# Patient Record
Sex: Female | Born: 1980 | Race: White | Hispanic: No | Marital: Married | Smoking: Current every day smoker
Health system: Southern US, Community
[De-identification: ages and names within clinical notes are randomized; demographics above are authoritative.]

## PROBLEM LIST (undated history)

## (undated) DIAGNOSIS — E282 Polycystic ovarian syndrome: Secondary | ICD-10-CM

## (undated) DIAGNOSIS — D219 Benign neoplasm of connective and other soft tissue, unspecified: Secondary | ICD-10-CM

## (undated) DIAGNOSIS — E669 Obesity, unspecified: Secondary | ICD-10-CM

## (undated) DIAGNOSIS — K829 Disease of gallbladder, unspecified: Secondary | ICD-10-CM

## (undated) HISTORY — PX: APPENDECTOMY: SHX54

## (undated) HISTORY — PX: CARPAL TUNNEL RELEASE: SHX101

## (undated) HISTORY — PX: ULNAR NERVE REPAIR: SHX2594

## (undated) HISTORY — PX: OTHER SURGICAL HISTORY: SHX169

## (undated) HISTORY — PX: GANGLION CYST EXCISION: SHX1691

---

## 2003-12-30 ENCOUNTER — Emergency Department (HOSPITAL_COMMUNITY): Admission: EM | Admit: 2003-12-30 | Discharge: 2003-12-30 | Payer: Self-pay | Admitting: Emergency Medicine

## 2013-08-13 ENCOUNTER — Emergency Department (HOSPITAL_BASED_OUTPATIENT_CLINIC_OR_DEPARTMENT_OTHER): Payer: Self-pay

## 2013-08-13 ENCOUNTER — Emergency Department (HOSPITAL_BASED_OUTPATIENT_CLINIC_OR_DEPARTMENT_OTHER)
Admission: EM | Admit: 2013-08-13 | Discharge: 2013-08-13 | Disposition: A | Discharge status: Home / Self Care | Payer: Self-pay | Attending: Emergency Medicine | Admitting: Emergency Medicine

## 2013-08-13 ENCOUNTER — Encounter (HOSPITAL_BASED_OUTPATIENT_CLINIC_OR_DEPARTMENT_OTHER): Payer: Self-pay | Admitting: Emergency Medicine

## 2013-08-13 DIAGNOSIS — R059 Cough, unspecified: Secondary | ICD-10-CM | POA: Insufficient documentation

## 2013-08-13 DIAGNOSIS — F17200 Nicotine dependence, unspecified, uncomplicated: Secondary | ICD-10-CM | POA: Insufficient documentation

## 2013-08-13 DIAGNOSIS — R05 Cough: Secondary | ICD-10-CM | POA: Insufficient documentation

## 2013-08-13 DIAGNOSIS — Z91040 Latex allergy status: Secondary | ICD-10-CM | POA: Insufficient documentation

## 2013-08-13 DIAGNOSIS — J40 Bronchitis, not specified as acute or chronic: Secondary | ICD-10-CM

## 2013-08-13 HISTORY — DX: Polycystic ovarian syndrome: E28.2

## 2013-08-13 MED ORDER — AZITHROMYCIN 250 MG PO TABS: 250.0000 mg | ORAL_TABLET | Freq: Every day | ORAL | Status: AC

## 2013-08-13 MED ORDER — HYDROCODONE-HOMATROPINE 5-1.5 MG/5ML PO SYRP: 5.0000 mL | ORAL_SOLUTION | Freq: Four times a day (QID) | ORAL | Status: DC | PRN

## 2013-08-13 NOTE — ED Provider Notes (Signed)
Medical screening examination/treatment/procedure(s) were performed by non-physician practitioner and as supervising physician I was immediately available for consultation/collaboration.  EKG Interpretation   None         Tasheka Houseman, MD 08/13/13 2344 

## 2013-08-13 NOTE — ED Provider Notes (Signed)
CSN: [id number]     Arrival date & time 08/13/13  1715 History   First MD Initiated Contact with Patient 08/13/13 1728     Chief Complaint  Patient presents with  . URI   (Consider location/radiation/quality/duration/timing/severity/associated sxs/prior Treatment) Patient is a 33 y.o. female presenting with URI. The history is provided by the patient. No language interpreter was used.  URI Presenting symptoms: congestion and cough   Severity:  Moderate Onset quality:  Gradual Duration:  1 week Timing:  Constant Progression:  Worsening Chronicity:  New Relieved by:  Nothing Worsened by:  Nothing tried Ineffective treatments:  None tried Associated symptoms: sinus pain   Associated symptoms: no headaches   Risk factors: no sick contacts     Past Medical History  Diagnosis Date  . Polycystic ovaries    Past Surgical History  Procedure Laterality Date  . Cesarean section    . Appendectomy    . Carpal tunnel release     History reviewed. No pertinent family history. History  Substance Use Topics  . Smoking status: Current Every Day Smoker -- 1.00 packs/day    Types: Cigarettes  . Smokeless tobacco: Not on file  . Alcohol Use: No   OB History   Grav Para Term Preterm Abortions TAB SAB Ect Mult Living                 Review of Systems  HENT: Positive for congestion.   Respiratory: Positive for cough.   Neurological: Negative for headaches.  All other systems reviewed and are negative.    Allergies  Latex and Toradol  Home Medications  No current outpatient prescriptions on file. BP 151/93  Pulse 113  Temp(Src) 100 F (37.8 C) (Oral)  Resp 18  Ht 5\' 3"  (1.6 m)  Wt 218 lb (98.884 kg)  BMI 38.63 kg/m2  SpO2 95% Physical Exam  Nursing note and vitals reviewed. Constitutional: She appears well-developed and well-nourished.  HENT:  Head: Normocephalic.  Right Ear: External ear normal.  Left Ear: External ear normal.  Nose: Nose normal.  Mouth/Throat:  Oropharynx is clear and moist.  Eyes: Conjunctivae and EOM are normal. Pupils are equal, round, and reactive to light.  Neck: Normal range of motion.  Cardiovascular: Normal rate and normal heart sounds.   Pulmonary/Chest: Effort normal and breath sounds normal.  Abdominal: Soft.  Musculoskeletal: Normal range of motion.  Neurological: She is alert.  Skin: Skin is warm.  Psychiatric: She has a normal mood and affect.    ED Course  Procedures (including critical care time) Labs Review Labs Reviewed - No data to display Imaging Review Dg Chest 2 View  08/13/2013   CLINICAL DATA:  Cough, congestion, smoker  EXAM: CHEST  2 VIEW  COMPARISON:  None.  FINDINGS: Cardiomediastinal silhouette is unremarkable. No acute infiltrate or pleural effusion. No pulmonary edema. Central mild bronchitic changes.  IMPRESSION: No acute infiltrate or pulmonary edema. Central mild bronchitic changes.   Electronically Signed   By: Natasha Mead M.D.   On: 08/13/2013 18:33    EKG Interpretation   None       MDM  Pt has productive cough, smoker,    Pt given rx for zithromax,  Hycodan for cough   1. Bronchitis    No results found for this or any previous visit. Dg Chest 2 View  08/13/2013   CLINICAL DATA:  Cough, congestion, smoker  EXAM: CHEST  2 VIEW  COMPARISON:  None.  FINDINGS: Cardiomediastinal silhouette is unremarkable.  No acute infiltrate or pleural effusion. No pulmonary edema. Central mild bronchitic changes.  IMPRESSION: No acute infiltrate or pulmonary edema. Central mild bronchitic changes.   Electronically Signed   By: Natasha Mead M.D.   On: 08/13/2013 18:33       Elson Areas, PA-C 08/13/13 1857

## 2013-08-13 NOTE — ED Notes (Signed)
Pt reports cough and fever x 1 week.  °

## 2013-08-13 NOTE — ED Notes (Signed)
Pt given cool wet washcloth, as she reports being extremely hot.

## 2013-09-15 ENCOUNTER — Emergency Department (HOSPITAL_BASED_OUTPATIENT_CLINIC_OR_DEPARTMENT_OTHER): Payer: Self-pay

## 2013-09-15 ENCOUNTER — Encounter (HOSPITAL_BASED_OUTPATIENT_CLINIC_OR_DEPARTMENT_OTHER): Payer: Self-pay | Admitting: Emergency Medicine

## 2013-09-15 ENCOUNTER — Emergency Department (HOSPITAL_BASED_OUTPATIENT_CLINIC_OR_DEPARTMENT_OTHER)
Admission: EM | Admit: 2013-09-15 | Discharge: 2013-09-15 | Disposition: A | Discharge status: Home / Self Care | Payer: Self-pay | Attending: Emergency Medicine | Admitting: Emergency Medicine

## 2013-09-15 DIAGNOSIS — Z792 Long term (current) use of antibiotics: Secondary | ICD-10-CM | POA: Insufficient documentation

## 2013-09-15 DIAGNOSIS — Z91040 Latex allergy status: Secondary | ICD-10-CM | POA: Insufficient documentation

## 2013-09-15 DIAGNOSIS — Z862 Personal history of diseases of the blood and blood-forming organs and certain disorders involving the immune mechanism: Secondary | ICD-10-CM | POA: Insufficient documentation

## 2013-09-15 DIAGNOSIS — Z8639 Personal history of other endocrine, nutritional and metabolic disease: Secondary | ICD-10-CM | POA: Insufficient documentation

## 2013-09-15 DIAGNOSIS — J45909 Unspecified asthma, uncomplicated: Secondary | ICD-10-CM

## 2013-09-15 DIAGNOSIS — J3489 Other specified disorders of nose and nasal sinuses: Secondary | ICD-10-CM | POA: Insufficient documentation

## 2013-09-15 DIAGNOSIS — R509 Fever, unspecified: Secondary | ICD-10-CM | POA: Insufficient documentation

## 2013-09-15 DIAGNOSIS — IMO0001 Reserved for inherently not codable concepts without codable children: Secondary | ICD-10-CM | POA: Insufficient documentation

## 2013-09-15 DIAGNOSIS — J45901 Unspecified asthma with (acute) exacerbation: Secondary | ICD-10-CM | POA: Insufficient documentation

## 2013-09-15 DIAGNOSIS — F17200 Nicotine dependence, unspecified, uncomplicated: Secondary | ICD-10-CM | POA: Insufficient documentation

## 2013-09-15 MED ORDER — ALBUTEROL SULFATE (2.5 MG/3ML) 0.083% IN NEBU: 2.5000 mg | INHALATION_SOLUTION | Freq: Once | RESPIRATORY_TRACT | Status: DC

## 2013-09-15 MED ORDER — PREDNISONE 20 MG PO TABS: 40.0000 mg | ORAL_TABLET | Freq: Every day | ORAL | Status: AC

## 2013-09-15 NOTE — Discharge Instructions (Signed)

## 2013-09-15 NOTE — ED Notes (Signed)
Fever, cough worse at night.

## 2013-09-15 NOTE — ED Provider Notes (Signed)
Medical screening examination/treatment/procedure(s) were performed by non-physician practitioner and as supervising physician I was immediately available for consultation/collaboration.  EKG Interpretation   None         Sharyon Cable, MD 09/15/13 1546

## 2013-09-15 NOTE — ED Provider Notes (Signed)
CSN: [id number]     Arrival date & time 09/15/13  1333 History   First MD Initiated Contact with Patient 09/15/13 1344     Chief Complaint  Patient presents with  . Influenza   (Consider location/radiation/quality/duration/timing/severity/associated sxs/prior Treatment) Patient is a 33 y.o. female presenting with URI. The history is provided by the patient.  URI Presenting symptoms: congestion, cough, fatigue, fever ( subjective) and rhinorrhea   Presenting symptoms: no sore throat   Congestion:    Location:  Chest   Interferes with sleep: yes     Interferes with eating/drinking: no   Cough:    Cough characteristics:  Hacking   Sputum characteristics:  Nondescript   Severity:  Moderate   Onset quality:  Gradual   Duration:  1 month   Timing:  Intermittent   Progression:  Unchanged   Chronicity:  Recurrent Severity:  Moderate Onset quality:  Gradual Duration:  1 month Timing:  Intermittent Progression:  Unchanged Chronicity:  Recurrent Relieved by:  None tried Exacerbated by: worse at night. Ineffective treatments:  OTC medications (couple of puffs intermittently with albuterol MDI) Associated symptoms: myalgias and wheezing   Associated symptoms: no arthralgias, no headaches, no sinus pain and no swollen glands   Risk factors comment:  Smoker   Past Medical History  Diagnosis Date  . Polycystic ovaries    Past Surgical History  Procedure Laterality Date  . Cesarean section    . Appendectomy    . Carpal tunnel release     No family history on file. History  Substance Use Topics  . Smoking status: Current Every Day Smoker -- 1.00 packs/day    Types: Cigarettes  . Smokeless tobacco: Not on file  . Alcohol Use: No   OB History   Grav Para Term Preterm Abortions TAB SAB Ect Mult Living                 Review of Systems  Constitutional: Positive for fever ( subjective) and fatigue.  HENT: Positive for congestion and rhinorrhea. Negative for sore throat.    Eyes: Negative for redness.  Respiratory: Positive for cough and wheezing. Negative for shortness of breath.   Cardiovascular: Negative for palpitations.  Gastrointestinal: Negative for abdominal pain.  Genitourinary: Negative for dysuria.  Musculoskeletal: Positive for myalgias. Negative for arthralgias and back pain.  Skin: Negative for rash.  Neurological: Negative for headaches.  Hematological: Negative for adenopathy.  Psychiatric/Behavioral: Negative for agitation.    Allergies  Latex and Toradol  Home Medications   Current Outpatient Rx  Name  Route  Sig  Dispense  Refill  . azithromycin (ZITHROMAX) 250 MG tablet   Oral   Take 1 tablet (250 mg total) by mouth daily. Take first 2 tablets together, then 1 every day until finished.   6 tablet   0   . HYDROcodone-homatropine (HYDROMET) 5-1.5 MG/5ML syrup   Oral   Take 5 mLs by mouth every 6 (six) hours as needed for cough.   120 mL   0   . predniSONE (DELTASONE) 20 MG tablet   Oral   Take 2 tablets (40 mg total) by mouth daily.   10 tablet   0    BP 143/84  Pulse 89  Temp(Src) 99 F (37.2 C) (Oral)  Resp 20  Ht 5\' 3"  (1.6 m)  Wt 218 lb (98.884 kg)  BMI 38.63 kg/m2  SpO2 99% Physical Exam  Nursing note and vitals reviewed. Constitutional: She is oriented to person, place, and  time. She appears well-developed and well-nourished.  HENT:  Head: Normocephalic and atraumatic.  Right Ear: Tympanic membrane and external ear normal.  Left Ear: Tympanic membrane and external ear normal.  Nose: Nose normal.  Mouth/Throat: Oropharynx is clear and moist.  Eyes: Conjunctivae and EOM are normal.  Cardiovascular: Normal rate, regular rhythm, normal heart sounds and intact distal pulses.   Pulmonary/Chest: Effort normal. She has decreased breath sounds in the right lower field and the left lower field.  Abdominal: Soft. There is no tenderness.  Musculoskeletal: Normal range of motion. She exhibits no edema.   Neurological: She is alert and oriented to person, place, and time.  Skin: Skin is warm and dry.  Psychiatric: She has a normal mood and affect.    ED Course  Procedures (including critical care time) Labs Review Labs Reviewed - No data to display Imaging Review Dg Chest 2 View  09/15/2013   CLINICAL DATA:  Cough and congestion with fever and chills.  EXAM: CHEST  2 VIEW  COMPARISON:  08/13/2013.  FINDINGS: Cardiopericardial silhouette within normal limits. Mediastinal contours normal. Trachea midline. No airspace disease or effusion.  IMPRESSION: No active cardiopulmonary disease.   Electronically Signed   By: Dereck Ligas M.D.   On: 09/15/2013 14:33    EKG Interpretation   None       MDM   1. Reactive airway disease    33 yo female with hx of reactive airway presents with persistent cough s/p URI 1 month ago. Has already completed a course of zithromax, CXR neg no pneumonia. Afebrile and nontoxic appearing here, doubt bacterial pneumonia. Likely reactive airway exacerbation, will treat with short course of steroids and recommend continued use of albuterol. Stable for d/c, arrange f/u with a PCP.    Liliane Bade, NP 09/15/13 [phone]

## 2013-09-22 ENCOUNTER — Emergency Department (HOSPITAL_BASED_OUTPATIENT_CLINIC_OR_DEPARTMENT_OTHER)
Admission: EM | Admit: 2013-09-22 | Discharge: 2013-09-22 | Disposition: A | Discharge status: Home / Self Care | Payer: Self-pay | Attending: Emergency Medicine | Admitting: Emergency Medicine

## 2013-09-22 ENCOUNTER — Emergency Department (HOSPITAL_BASED_OUTPATIENT_CLINIC_OR_DEPARTMENT_OTHER): Payer: Self-pay

## 2013-09-22 ENCOUNTER — Encounter (HOSPITAL_BASED_OUTPATIENT_CLINIC_OR_DEPARTMENT_OTHER): Payer: Self-pay | Admitting: Emergency Medicine

## 2013-09-22 DIAGNOSIS — Z792 Long term (current) use of antibiotics: Secondary | ICD-10-CM | POA: Insufficient documentation

## 2013-09-22 DIAGNOSIS — H9209 Otalgia, unspecified ear: Secondary | ICD-10-CM | POA: Insufficient documentation

## 2013-09-22 DIAGNOSIS — Z8639 Personal history of other endocrine, nutritional and metabolic disease: Secondary | ICD-10-CM | POA: Insufficient documentation

## 2013-09-22 DIAGNOSIS — Z862 Personal history of diseases of the blood and blood-forming organs and certain disorders involving the immune mechanism: Secondary | ICD-10-CM | POA: Insufficient documentation

## 2013-09-22 DIAGNOSIS — J020 Streptococcal pharyngitis: Secondary | ICD-10-CM | POA: Insufficient documentation

## 2013-09-22 DIAGNOSIS — IMO0001 Reserved for inherently not codable concepts without codable children: Secondary | ICD-10-CM | POA: Insufficient documentation

## 2013-09-22 DIAGNOSIS — F17200 Nicotine dependence, unspecified, uncomplicated: Secondary | ICD-10-CM | POA: Insufficient documentation

## 2013-09-22 DIAGNOSIS — Z91040 Latex allergy status: Secondary | ICD-10-CM | POA: Insufficient documentation

## 2013-09-22 LAB — CBC WITH DIFFERENTIAL/PLATELET
Basophils Absolute: 0 10*3/uL (ref 0.0–0.1)
Basophils Relative: 0 % (ref 0–1)
Eosinophils Absolute: 0.2 10*3/uL (ref 0.0–0.7)
Eosinophils Relative: 1 % (ref 0–5)
HEMATOCRIT: 38.9 % (ref 36.0–46.0)
HEMOGLOBIN: 13.2 g/dL (ref 12.0–15.0)
LYMPHS ABS: 2.6 10*3/uL (ref 0.7–4.0)
Lymphocytes Relative: 19 % (ref 12–46)
MCH: 29.5 pg (ref 26.0–34.0)
MCHC: 33.9 g/dL (ref 30.0–36.0)
MCV: 86.8 fL (ref 78.0–100.0)
MONO ABS: 1 10*3/uL (ref 0.1–1.0)
MONOS PCT: 8 % (ref 3–12)
NEUTROS PCT: 72 % (ref 43–77)
Neutro Abs: 9.6 10*3/uL — ABNORMAL HIGH (ref 1.7–7.7)
Platelets: 271 10*3/uL (ref 150–400)
RBC: 4.48 MIL/uL (ref 3.87–5.11)
RDW: 14.7 % (ref 11.5–15.5)
WBC: 13.4 10*3/uL — ABNORMAL HIGH (ref 4.0–10.5)

## 2013-09-22 LAB — BASIC METABOLIC PANEL
BUN: 14 mg/dL (ref 6–23)
CHLORIDE: 100 meq/L (ref 96–112)
CO2: 24 mEq/L (ref 19–32)
CREATININE: 0.7 mg/dL (ref 0.50–1.10)
Calcium: 9.3 mg/dL (ref 8.4–10.5)
GFR calc Af Amer: >90 mL/min (ref >90)
GFR calc non Af Amer: >90 mL/min (ref >90)
GLUCOSE: 100 mg/dL — AB (ref 70–99)
Potassium: 3.7 mEq/L (ref 3.7–5.3)
Sodium: 139 mEq/L (ref 137–147)

## 2013-09-22 LAB — RAPID STREP SCREEN (MED CTR MEBANE ONLY): Streptococcus, Group A Screen (Direct): POSITIVE — AB

## 2013-09-22 MED ORDER — GI COCKTAIL ~~LOC~~
30.0000 mL | Freq: Once | ORAL | Status: AC
  Administered 2013-09-22: 30 mL via ORAL

## 2013-09-22 MED ORDER — PENICILLIN G BENZATHINE 1200000 UNIT/2ML IM SUSP
1.2000 10*6.[IU] | Freq: Once | INTRAMUSCULAR | Status: AC
  Administered 2013-09-22: 1.2 10*6.[IU] via INTRAMUSCULAR
  Filled 2013-09-22: qty 2

## 2013-09-22 MED ORDER — IOHEXOL 300 MG/ML  SOLN
75.0000 mL | Freq: Once | INTRAMUSCULAR | Status: AC | PRN
  Administered 2013-09-22: 75 mL via INTRAVENOUS

## 2013-09-22 MED ORDER — HYDROMORPHONE HCL PF 1 MG/ML IJ SOLN
0.5000 mg | Freq: Once | INTRAMUSCULAR | Status: AC
  Administered 2013-09-22: 0.5 mg via INTRAVENOUS
  Filled 2013-09-22: qty 1

## 2013-09-22 MED ORDER — HYDROCODONE-ACETAMINOPHEN 7.5-325 MG/15ML PO SOLN: 15.0000 mL | Freq: Four times a day (QID) | ORAL | Status: AC | PRN

## 2013-09-22 MED ORDER — HYDROCOD POLST-CHLORPHEN POLST 10-8 MG/5ML PO LQCR
5.0000 mL | Freq: Once | ORAL | Status: AC
  Administered 2013-09-22: 5 mL via ORAL
  Filled 2013-09-22: qty 5

## 2013-09-22 MED ORDER — DEXAMETHASONE SODIUM PHOSPHATE 10 MG/ML IJ SOLN
10.0000 mg | Freq: Once | INTRAMUSCULAR | Status: AC
  Administered 2013-09-22: 10 mg via INTRAVENOUS
  Filled 2013-09-22: qty 1

## 2013-09-22 MED ORDER — GI COCKTAIL ~~LOC~~
ORAL | Status: AC
  Filled 2013-09-22: qty 30

## 2013-09-22 NOTE — ED Provider Notes (Signed)
CSN: [id number]     Arrival date & time 09/22/13  1802 History   First MD Initiated Contact with Patient 09/22/13 1824     Chief Complaint  Patient presents with  . URI   (Consider location/radiation/quality/duration/timing/severity/associated sxs/prior Treatment) HPI Comments: Patient is otherwise healthy 33 year old female who presents to the ED for re-evaluation of URI type symptoms.  She reports continued cough with green sputum production, sore throat, fever, chills, headache, body aches.  She states that she has been seen twice here before and told she had a virus.  She states that she went for a job interview and [employer] today and the doctor at the urgent care told her she may have a peritonsilar abscess.  Patient is a 33 y.o. female presenting with URI. The history is provided by the patient. No language interpreter was used.  URI Presenting symptoms: congestion, cough, ear pain, fever and sore throat   Presenting symptoms: no facial pain, no fatigue and no rhinorrhea   Severity:  Severe Onset quality:  Gradual Duration:  10 days Timing:  Constant Progression:  Worsening Chronicity:  New Relieved by:  Decongestant and OTC medications Worsened by:  Nothing tried Ineffective treatments:  None tried Associated symptoms: headaches and myalgias   Associated symptoms: no arthralgias, no neck pain, no sinus pain, no sneezing, no swollen glands and no wheezing   Risk factors: no diabetes mellitus and no sick contacts     Past Medical History  Diagnosis Date  . Polycystic ovaries    Past Surgical History  Procedure Laterality Date  . Cesarean section    . Appendectomy    . Carpal tunnel release     History reviewed. No pertinent family history. History  Substance Use Topics  . Smoking status: Current Every Day Smoker -- 1.00 packs/day    Types: Cigarettes  . Smokeless tobacco: Not on file  . Alcohol Use: No   OB History   Grav Para Term Preterm Abortions TAB SAB Ect Mult  Living                 Review of Systems  Constitutional: Positive for fever. Negative for fatigue.  HENT: Positive for congestion, ear pain and sore throat. Negative for rhinorrhea and sneezing.   Respiratory: Positive for cough. Negative for wheezing.   Musculoskeletal: Positive for myalgias. Negative for arthralgias and neck pain.  Neurological: Positive for headaches.  All other systems reviewed and are negative.    Allergies  Latex and Toradol  Home Medications   Current Outpatient Rx  Name  Route  Sig  Dispense  Refill  . azithromycin (ZITHROMAX) 250 MG tablet   Oral   Take 1 tablet (250 mg total) by mouth daily. Take first 2 tablets together, then 1 every day until finished.   6 tablet   0   . HYDROcodone-acetaminophen (HYCET) 7.5-325 mg/15 ml solution   Oral   Take 15 mLs by mouth 4 (four) times daily as needed for moderate pain.   120 mL   0   . HYDROcodone-homatropine (HYDROMET) 5-1.5 MG/5ML syrup   Oral   Take 5 mLs by mouth every 6 (six) hours as needed for cough.   120 mL   0    BP 137/82  Pulse 100  Temp(Src) 98.5 F (36.9 C) (Oral)  Resp 18  Ht 5\' 3"  (1.6 m)  Wt 218 lb (98.884 kg)  BMI 38.63 kg/m2  SpO2 98% Physical Exam  Nursing note and vitals reviewed.  Constitutional: She is oriented to person, place, and time. She appears well-developed and well-nourished. No distress.  HENT:  Head: Normocephalic and atraumatic.  Right Ear: External ear normal.  Left Ear: External ear normal.  Mouth/Throat: Mucous membranes are normal. No trismus in the jaw. No uvula swelling. Oropharyngeal exudate and posterior oropharyngeal erythema present. No tonsillar abscesses.    Eyes: Conjunctivae are normal. Pupils are equal, round, and reactive to light. No scleral icterus.  Neck: Normal range of motion. Neck supple.  Right anterior cervical adenopathy  Cardiovascular: Normal rate, regular rhythm and normal heart sounds.  Exam reveals no gallop and no friction  rub.   No murmur heard. Pulmonary/Chest: Effort normal and breath sounds normal. No respiratory distress. She has no wheezes. She has no rales. She exhibits no tenderness.  Abdominal: Soft. Bowel sounds are normal. She exhibits no distension. There is no tenderness.  Musculoskeletal: Normal range of motion. She exhibits no edema and no tenderness.  Lymphadenopathy:    She has cervical adenopathy.  Neurological: She is alert and oriented to person, place, and time. She exhibits normal muscle tone. Coordination normal.  Skin: Skin is warm and dry. No rash noted. No erythema. No pallor.  Psychiatric: She has a normal mood and affect. Her behavior is normal. Judgment and thought content normal.    ED Course  Procedures (including critical care time) Labs Review Labs Reviewed  RAPID STREP SCREEN - Abnormal; Notable for the following:    Streptococcus, Group A Screen (Direct) POSITIVE (*)    All other components within normal limits  CBC WITH DIFFERENTIAL - Abnormal; Notable for the following:    WBC 13.4 (*)    Neutro Abs 9.6 (*)    All other components within normal limits  BASIC METABOLIC PANEL - Abnormal; Notable for the following:    Glucose, Bld 100 (*)    All other components within normal limits   Imaging Review Ct Soft Tissue Neck W Contrast  09/22/2013   CLINICAL DATA:  URI symptoms, sore throat  EXAM: CT NECK WITH CONTRAST  TECHNIQUE: Multidetector CT imaging of the neck was performed using the standard protocol following the bolus administration of intravenous contrast.  CONTRAST:  70mL OMNIPAQUE IOHEXOL 300 MG/ML  SOLN  COMPARISON:  None available  FINDINGS: The visualized portions of the brain demonstrate a normal appearance. The visualized orbits are unremarkable.  The paranasal sinuses in and mastoid air cells are clear.  The salivary glands including the parotid glands and submandibular glands are unremarkable.  The oral cavity is within normal limits without evidence of  loculated fluid collection or mass lesion. The oropharynx including the palatine tonsils is within normal limits. The parapharyngeal fat is preserved. Airways normal widely patent. Nasopharynx including the adenoids are unremarkable. The vallecula is clear. Lingual tonsils are unremarkable. Epiglottis is normal. The hypopharynx and supraglottic larynx is within normal limits. True vocal cords are symmetric bilaterally. Subglottic airway is clear.  Trachea is midline and patent.  A few mildly prominent right level 2/3 nodes measuring up to 1.1 cm in short axis are present (series 5, image 44), which may be reactive in nature. No mass lesion or loculated fluid collection identified within the soft tissues of the neck.  Visualized superior mediastinum is within normal limits without evidence of adenopathy or mass lesion.  The visualized lung apices are clear.  Normal intravascular enhancement is seen throughout the neck.  No acute osseous abnormality identified. No focal lytic or blastic osseous lesions.  IMPRESSION:  1. No loculated fluid collection to suggest abscess identified within the neck. No significant inflammatory changes identified. 2. Mildly prominent right level II/III cervical adenopathy, which may be reactive in nature. Clinical followup to resolution is recommended   Electronically Signed   By: Jeannine Boga M.D.   On: 09/22/2013 20:53    EKG Interpretation   None      Medications  dexamethasone (DECADRON) injection 10 mg (10 mg Intravenous Given 09/22/13 1849)  chlorpheniramine-HYDROcodone (TUSSIONEX) 10-8 MG/5ML suspension 5 mL (5 mLs Oral Given 09/22/13 1849)  HYDROmorphone (DILAUDID) injection 0.5 mg (0.5 mg Intravenous Given 09/22/13 1850)  iohexol (OMNIPAQUE) 300 MG/ML solution 75 mL (75 mLs Intravenous Contrast Given 09/22/13 2010)  gi cocktail (Maalox,Lidocaine,Donnatal) (30 mLs Oral Given 09/22/13 2022)  penicillin g benzathine (BICILLIN LA) 1200000 UNIT/2ML injection 1.2 Million  Units (1.2 Million Units Intramuscular Given 09/22/13 2052)     MDM   1. Strep pharyngitis    Patient here with continued URI symptoms now with sore throat, clinically with right greater than left tonsilar swelling and erythema, no abscess from CT scan, pain more tolerable after medications here.  No trismus, nausea, vomiting.    Idalia Needle Joelyn Oms, [place] 09/22/13 2202

## 2013-09-22 NOTE — ED Notes (Addendum)
Pt c/o URI symptoms  And sore throat x 1 week sent here from [facility] for abscess to throat

## 2013-09-22 NOTE — ED Notes (Signed)
PA at bedside.

## 2013-09-22 NOTE — Discharge Instructions (Signed)
Strep Throat  Strep throat is an infection of the throat caused by a bacteria named Streptococcus pyogenes. Your caregiver may call the infection streptococcal "tonsillitis" or "pharyngitis" depending on whether there are signs of inflammation in the tonsils or back of the throat. Strep throat is most common in children aged 35 15 years during the cold months of the year, but it can occur in people of any age during any season. This infection is spread from person to person (contagious) through coughing, sneezing, or other close contact.  SYMPTOMS   · Fever or chills.  · Painful, swollen, red tonsils or throat.  · Pain or difficulty when swallowing.  · White or yellow spots on the tonsils or throat.  · Swollen, tender lymph nodes or "glands" of the neck or under the jaw.  · Red rash all over the body (rare).  DIAGNOSIS   Many different infections can cause the same symptoms. A test must be done to confirm the diagnosis so the right treatment can be given. A "rapid strep test" can help your caregiver make the diagnosis in a few minutes. If this test is not available, a light swab of the infected area can be used for a throat culture test. If a throat culture test is done, results are usually available in a day or two.  TREATMENT   Strep throat is treated with antibiotic medicine.  HOME CARE INSTRUCTIONS   · Gargle with 1 tsp of salt in 1 cup of warm water, 3 4 times per day or as needed for comfort.  · Family members who also have a sore throat or fever should be tested for strep throat and treated with antibiotics if they have the strep infection.  · Make sure everyone in your household washes their hands well.  · Do not share food, drinking cups, or personal items that could cause the infection to spread to others.  · You may need to eat a soft food diet until your sore throat gets better.  · Drink enough water and fluids to keep your urine clear or pale yellow. This will help prevent dehydration.  · Get plenty of  rest.  · Stay home from school, daycare, or work until you have been on antibiotics for 24 hours.  · Only take over-the-counter or prescription medicines for pain, discomfort, or fever as directed by your caregiver.  · If antibiotics are prescribed, take them as directed. Finish them even if you start to feel better.  SEEK MEDICAL CARE IF:   · The glands in your neck continue to enlarge.  · You develop a rash, cough, or earache.  · You cough up green, yellow-brown, or bloody sputum.  · You have pain or discomfort not controlled by medicines.  · Your problems seem to be getting worse rather than better.  SEEK IMMEDIATE MEDICAL CARE IF:   · You develop any new symptoms such as vomiting, severe headache, stiff or painful neck, chest pain, shortness of breath, or trouble swallowing.  · You develop severe throat pain, drooling, or changes in your voice.  · You develop swelling of the neck, or the skin on the neck becomes red and tender.  · You have a fever.  · You develop signs of dehydration, such as fatigue, dry mouth, and decreased urination.  · You become increasingly sleepy, or you cannot wake up completely.  Document Released: 08/25/2000 Document Revised: 08/14/2012 Document Reviewed: 10/27/2010  [employer]® Patient Information ©2014 [employer].

## 2013-09-23 NOTE — ED Provider Notes (Signed)
Medical screening examination/treatment/procedure(s) were performed by non-physician practitioner and as supervising physician I was immediately available for consultation/collaboration.  EKG Interpretation   None         Ephraim Hamburger, MD 09/23/13 [phone]

## 2013-09-27 ENCOUNTER — Encounter (HOSPITAL_BASED_OUTPATIENT_CLINIC_OR_DEPARTMENT_OTHER): Payer: Self-pay | Admitting: Emergency Medicine

## 2013-09-27 ENCOUNTER — Emergency Department (HOSPITAL_BASED_OUTPATIENT_CLINIC_OR_DEPARTMENT_OTHER)
Admission: EM | Admit: 2013-09-27 | Discharge: 2013-09-27 | Disposition: A | Discharge status: Home / Self Care | Payer: Self-pay | Attending: Emergency Medicine | Admitting: Emergency Medicine

## 2013-09-27 DIAGNOSIS — Z862 Personal history of diseases of the blood and blood-forming organs and certain disorders involving the immune mechanism: Secondary | ICD-10-CM | POA: Insufficient documentation

## 2013-09-27 DIAGNOSIS — J0300 Acute streptococcal tonsillitis, unspecified: Secondary | ICD-10-CM

## 2013-09-27 DIAGNOSIS — F17200 Nicotine dependence, unspecified, uncomplicated: Secondary | ICD-10-CM | POA: Insufficient documentation

## 2013-09-27 DIAGNOSIS — Z792 Long term (current) use of antibiotics: Secondary | ICD-10-CM | POA: Insufficient documentation

## 2013-09-27 DIAGNOSIS — R000 Tachycardia, unspecified: Secondary | ICD-10-CM | POA: Insufficient documentation

## 2013-09-27 DIAGNOSIS — Z91040 Latex allergy status: Secondary | ICD-10-CM | POA: Insufficient documentation

## 2013-09-27 DIAGNOSIS — Z8639 Personal history of other endocrine, nutritional and metabolic disease: Secondary | ICD-10-CM | POA: Insufficient documentation

## 2013-09-27 DIAGNOSIS — J020 Streptococcal pharyngitis: Secondary | ICD-10-CM | POA: Insufficient documentation

## 2013-09-27 MED ORDER — CLINDAMYCIN HCL 150 MG PO CAPS: 150.0000 mg | ORAL_CAPSULE | Freq: Four times a day (QID) | ORAL | Status: AC

## 2013-09-27 MED ORDER — LIDOCAINE VISCOUS 2 % MT SOLN: 20.0000 mL | OROMUCOSAL | Status: AC | PRN

## 2013-09-27 MED ORDER — LIDOCAINE VISCOUS 2 % MT SOLN
15.0000 mL | Freq: Once | OROMUCOSAL | Status: AC
  Administered 2013-09-27: 15 mL via OROMUCOSAL
  Filled 2013-09-27: qty 15

## 2013-09-27 MED ORDER — HYDROMORPHONE HCL PF 1 MG/ML IJ SOLN
1.0000 mg | Freq: Once | INTRAMUSCULAR | Status: AC
  Administered 2013-09-27: 1 mg via INTRAVENOUS
  Filled 2013-09-27: qty 1

## 2013-09-27 MED ORDER — DEXAMETHASONE SODIUM PHOSPHATE 10 MG/ML IJ SOLN
10.0000 mg | Freq: Once | INTRAMUSCULAR | Status: AC
  Administered 2013-09-27: 10 mg via INTRAVENOUS
  Filled 2013-09-27: qty 1

## 2013-09-27 MED ORDER — SODIUM CHLORIDE 0.9 % IV BOLUS (SEPSIS)
1000.0000 mL | Freq: Once | INTRAVENOUS | Status: AC
  Administered 2013-09-27: 1000 mL via INTRAVENOUS

## 2013-09-27 MED ORDER — CLINDAMYCIN PHOSPHATE 900 MG/50ML IV SOLN
900.0000 mg | Freq: Once | INTRAVENOUS | Status: AC
  Administered 2013-09-27: 900 mg via INTRAVENOUS
  Filled 2013-09-27: qty 50

## 2013-09-27 NOTE — ED Provider Notes (Signed)
CSN: [id number]     Arrival date & time 09/27/13  5427 History   First MD Initiated Contact with Patient 09/27/13 1204     Chief Complaint  Patient presents with  . Sore Throat   (Consider location/radiation/quality/duration/timing/severity/associated sxs/prior Treatment) HPI Chelsea Davidson is a 33 y.o. female who presents to the ED with a sore throat. She was evaluated 5 days ago and given Bicillin injection for strep and started on steroids. She complains of increased swelling of her tonsils. The right is worse than the left and she is afraid she is developing an abscess. She is taking the liquid pain medication but continues to have severe pain. She has no fever or chills. She denies any other problems at this time.  Past Medical History  Diagnosis Date  . Polycystic ovaries    Past Surgical History  Procedure Laterality Date  . Cesarean section    . Appendectomy    . Carpal tunnel release     No family history on file. History  Substance Use Topics  . Smoking status: Current Every Day Smoker -- 1.00 packs/day    Types: Cigarettes  . Smokeless tobacco: Not on file  . Alcohol Use: No   OB History   Grav Para Term Preterm Abortions TAB SAB Ect Mult Living                 Review of Systems As stated in HPI  Allergies  Latex and Toradol  Home Medications   Current Outpatient Rx  Name  Route  Sig  Dispense  Refill  . azithromycin (ZITHROMAX) 250 MG tablet   Oral   Take 1 tablet (250 mg total) by mouth daily. Take first 2 tablets together, then 1 every day until finished.   6 tablet   0   . HYDROcodone-acetaminophen (HYCET) 7.5-325 mg/15 ml solution   Oral   Take 15 mLs by mouth 4 (four) times daily as needed for moderate pain.   120 mL   0   . HYDROcodone-homatropine (HYDROMET) 5-1.5 MG/5ML syrup   Oral   Take 5 mLs by mouth every 6 (six) hours as needed for cough.   120 mL   0    BP 141/88  Pulse 109  Temp(Src) 98.6 F (37 C) (Oral)  Resp 16  Ht 5\' 3"   (1.6 m)  Wt 218 lb (98.884 kg)  BMI 38.63 kg/m2  SpO2 98% Physical Exam  Nursing note and vitals reviewed. Constitutional: She is oriented to person, place, and time. She appears well-developed and well-nourished. No distress.  HENT:  Head: Normocephalic and atraumatic.  Right Ear: Tympanic membrane normal.  Left Ear: Tympanic membrane normal.  Nose: Nose normal.  Mouth/Throat: Uvula is midline. Oropharyngeal exudate and posterior oropharyngeal erythema present.    Eyes: Conjunctivae and EOM are normal.  Neck: Neck supple.  Cardiovascular: Regular rhythm.  Tachycardia present.   Pulmonary/Chest: Effort normal.  Abdominal: Soft. There is no tenderness.  Musculoskeletal: Normal range of motion.  Lymphadenopathy:    She has cervical adenopathy.  Neurological: She is alert and oriented to person, place, and time. No cranial nerve deficit.  Skin: Skin is warm and dry.  Psychiatric: She has a normal mood and affect. Her behavior is normal.    ED Course: Dr. Vanita Panda in to examine the patient. He does not feel that the CT needs to be repeated today.   Procedures  IV Clindamycin, IV steroids  MDM  33 y.o. female with enlarged tonsils and  positive strep screen. Will add Clindamycin to her antibiotics and lidocaine viscous  and she will follow up with ENT if symptoms persist. No difficulty swallowing, uvula midline. No tonsillar abscess at this time. Patient will return immediately for difficulty swallowing, high fever, persistent vomiting or other problems. Discussed with the patient clinical findings and plan of care. All questioned fully answered.   Medication List    TAKE these medications       clindamycin 150 MG capsule  Commonly known as:  CLEOCIN  Take 1 capsule (150 mg total) by mouth every 6 (six) hours.     lidocaine 2 % solution  Commonly known as:  XYLOCAINE  Use as directed 20 mLs in the mouth or throat as needed for mouth pain.      ASK your doctor about these  medications       azithromycin 250 MG tablet  Commonly known as:  ZITHROMAX  Take 1 tablet (250 mg total) by mouth daily. Take first 2 tablets together, then 1 every day until finished.     HYDROcodone-acetaminophen 7.5-325 mg/15 ml solution  Commonly known as:  HYCET  Take 15 mLs by mouth 4 (four) times daily as needed for moderate pain.     HYDROcodone-homatropine 5-1.5 MG/5ML syrup  Commonly known as:  HYDROMET  Take 5 mLs by mouth every 6 (six) hours as needed for cough.         [facility] Bunnie Pion, [place] 09/28/13 [phone]

## 2013-09-27 NOTE — ED Notes (Signed)
Patient here with sore throat ongoing x 1 week. Patient had bicillin and steroids on Monday for strep and now increased swelling to tonsils with increased pain. Using the liquid pain meds with some relief and no solid intake due to pain and swelling

## 2013-09-27 NOTE — ED Notes (Signed)
PA at bedside.

## 2013-09-27 NOTE — Discharge Instructions (Signed)
Continue your medications and take the medications we give you. Use salt water gargles and Chloraseptic spray to help with the pain. If you symptoms have not improved by Monday call [facility] ENT for follow up. If your symptoms worsen, return here. Return immediately for difficulty swallowing, high fevers, persistent vomiting or other problems.

## 2013-09-28 NOTE — ED Provider Notes (Signed)
  Medical screening examination/treatment/procedure(s) were performed by non-physician practitioner and as supervising physician I was immediately available for consultation/collaboration.    Carmin Muskrat, MD 09/28/13 1530

## 2013-10-15 ENCOUNTER — Emergency Department (HOSPITAL_BASED_OUTPATIENT_CLINIC_OR_DEPARTMENT_OTHER)
Admission: EM | Admit: 2013-10-15 | Discharge: 2013-10-15 | Disposition: A | Discharge status: Home / Self Care | Payer: Self-pay | Attending: Emergency Medicine | Admitting: Emergency Medicine

## 2013-10-15 ENCOUNTER — Emergency Department (HOSPITAL_BASED_OUTPATIENT_CLINIC_OR_DEPARTMENT_OTHER): Payer: Self-pay

## 2013-10-15 ENCOUNTER — Encounter (HOSPITAL_BASED_OUTPATIENT_CLINIC_OR_DEPARTMENT_OTHER): Payer: Self-pay | Admitting: Emergency Medicine

## 2013-10-15 DIAGNOSIS — Z862 Personal history of diseases of the blood and blood-forming organs and certain disorders involving the immune mechanism: Secondary | ICD-10-CM | POA: Insufficient documentation

## 2013-10-15 DIAGNOSIS — Z91040 Latex allergy status: Secondary | ICD-10-CM | POA: Insufficient documentation

## 2013-10-15 DIAGNOSIS — W108XXA Fall (on) (from) other stairs and steps, initial encounter: Secondary | ICD-10-CM | POA: Insufficient documentation

## 2013-10-15 DIAGNOSIS — Y92009 Unspecified place in unspecified non-institutional (private) residence as the place of occurrence of the external cause: Secondary | ICD-10-CM | POA: Insufficient documentation

## 2013-10-15 DIAGNOSIS — Z8639 Personal history of other endocrine, nutritional and metabolic disease: Secondary | ICD-10-CM | POA: Insufficient documentation

## 2013-10-15 DIAGNOSIS — S93402A Sprain of unspecified ligament of left ankle, initial encounter: Secondary | ICD-10-CM

## 2013-10-15 DIAGNOSIS — Y939 Activity, unspecified: Secondary | ICD-10-CM | POA: Insufficient documentation

## 2013-10-15 DIAGNOSIS — S93409A Sprain of unspecified ligament of unspecified ankle, initial encounter: Secondary | ICD-10-CM | POA: Insufficient documentation

## 2013-10-15 DIAGNOSIS — F17200 Nicotine dependence, unspecified, uncomplicated: Secondary | ICD-10-CM | POA: Insufficient documentation

## 2013-10-15 MED ORDER — HYDROCODONE-ACETAMINOPHEN 5-325 MG PO TABS
2.0000 | ORAL_TABLET | Freq: Once | ORAL | Status: AC
Start: 2013-10-15 — End: 2013-10-15
  Administered 2013-10-15: 2 via ORAL
  Filled 2013-10-15: qty 2

## 2013-10-15 MED ORDER — HYDROCODONE-ACETAMINOPHEN 5-325 MG PO TABS: 2.0000 | ORAL_TABLET | Freq: Four times a day (QID) | ORAL | Status: DC | PRN

## 2013-10-15 NOTE — Discharge Instructions (Signed)

## 2013-10-15 NOTE — ED Provider Notes (Signed)
CSN: [id number]     Arrival date & time 10/15/13  6734 History  This chart was scribed for Sheridan Hew B. Karle Starch, MD by Rolanda Lundborg, ED Scribe. This patient was seen in room MH05/MH05 and the patient's care was started at 9:18 PM.    Chief Complaint  Patient presents with  . Fall   The history is provided by the patient. No language interpreter was used.   HPI Comments: Chelsea Davidson is a 33 y.o. female who presents to the Emergency Department complaining of constant, sudden-onset left foot pain since falling down the stairs in her house around 6:30 this evening. She denies hitting her head or LOC.   Past Medical History  Diagnosis Date  . Polycystic ovaries    Past Surgical History  Procedure Laterality Date  . Cesarean section    . Appendectomy    . Carpal tunnel release     No family history on file. History  Substance Use Topics  . Smoking status: Current Every Day Smoker -- 1.00 packs/day    Types: Cigarettes  . Smokeless tobacco: Not on file  . Alcohol Use: No   OB History   Grav Para Term Preterm Abortions TAB SAB Ect Mult Living                 Review of Systems  Musculoskeletal: Positive for arthralgias (left foot).   A complete 10 system review of systems was obtained and all systems are negative except as noted in the HPI and PMH.   Allergies  Latex and Toradol  Home Medications   Current Outpatient Rx  Name  Route  Sig  Dispense  Refill  . azithromycin (ZITHROMAX) 250 MG tablet   Oral   Take 1 tablet (250 mg total) by mouth daily. Take first 2 tablets together, then 1 every day until finished.   6 tablet   0   . clindamycin (CLEOCIN) 150 MG capsule   Oral   Take 1 capsule (150 mg total) by mouth every 6 (six) hours.   28 capsule   0   . HYDROcodone-acetaminophen (HYCET) 7.5-325 mg/15 ml solution   Oral   Take 15 mLs by mouth 4 (four) times daily as needed for moderate pain.   120 mL   0   . HYDROcodone-homatropine (HYDROMET) 5-1.5 MG/5ML  syrup   Oral   Take 5 mLs by mouth every 6 (six) hours as needed for cough.   120 mL   0   . lidocaine (XYLOCAINE) 2 % solution   Mouth/Throat   Use as directed 20 mLs in the mouth or throat as needed for mouth pain.   100 mL   0    BP 115/82  Pulse 114  Temp(Src) 99.2 F (37.3 C) (Oral)  Resp 20  Ht 5\' 3"  (1.6 m)  Wt 216 lb (97.977 kg)  BMI 38.27 kg/m2  SpO2 98% Physical Exam  Nursing note and vitals reviewed. Constitutional: She is oriented to person, place, and time. She appears well-developed and well-nourished.  HENT:  Head: Normocephalic and atraumatic.  Eyes: EOM are normal. Pupils are equal, round, and reactive to light.  Neck: Normal range of motion. Neck supple.  Cardiovascular: Normal rate, normal heart sounds and intact distal pulses.   Pulmonary/Chest: Effort normal and breath sounds normal.  Abdominal: Bowel sounds are normal. She exhibits no distension. There is no tenderness.  Musculoskeletal: Normal range of motion. She exhibits no edema and no tenderness.  Tenderness over the anterior  left ankle no malleolar tenderness.   Neurological: She is alert and oriented to person, place, and time. She has normal strength. No cranial nerve deficit or sensory deficit.  Skin: Skin is warm and dry. No rash noted.  Psychiatric: She has a normal mood and affect.    ED Course  Procedures (including critical care time) Medications  HYDROcodone-acetaminophen (NORCO/VICODIN) 5-325 MG per tablet 2 tablet (not administered)    DIAGNOSTIC STUDIES: Oxygen Saturation is 98% on RA, normal by my interpretation.    COORDINATION OF CARE: 9:24 PM- Discussed treatment plan with pt which includes ankle brace and pain medication. Pt agrees to plan.    Labs Review Labs Reviewed - No data to display Imaging Review Dg Ankle Complete Left  10/15/2013   CLINICAL DATA:  Left ankle injury, pain.  EXAM: LEFT ANKLE COMPLETE - 3+ VIEW  COMPARISON:  None.  FINDINGS: No acute bony or  joint abnormality is identified. Soft tissue structures are unremarkable. No focal bony lesion.  IMPRESSION: Negative exam.   Electronically Signed   By: Inge Rise M.D.   On: 10/15/2013 21:00    EKG Interpretation   None       MDM   1. Sprain of ankle, left     Xray neg. ASO, crutches, pain meds. RICE.   I personally performed the services described in this documentation, which was scribed in my presence. The recorded information has been reviewed and is accurate.      Janylah Belgrave B. Karle Starch, MD 10/15/13 2152

## 2013-10-15 NOTE — ED Notes (Signed)
Fell down steps approx 30 min PTA-c/o pain from left mid tib/fib to toes

## 2014-01-19 ENCOUNTER — Encounter (HOSPITAL_BASED_OUTPATIENT_CLINIC_OR_DEPARTMENT_OTHER): Payer: Self-pay | Admitting: Emergency Medicine

## 2014-01-19 ENCOUNTER — Emergency Department (HOSPITAL_BASED_OUTPATIENT_CLINIC_OR_DEPARTMENT_OTHER)
Admission: EM | Admit: 2014-01-19 | Discharge: 2014-01-20 | Disposition: A | Discharge status: Home / Self Care | Payer: Self-pay | Attending: Emergency Medicine | Admitting: Emergency Medicine

## 2014-01-19 ENCOUNTER — Emergency Department (HOSPITAL_BASED_OUTPATIENT_CLINIC_OR_DEPARTMENT_OTHER): Payer: Self-pay

## 2014-01-19 DIAGNOSIS — Z8742 Personal history of other diseases of the female genital tract: Secondary | ICD-10-CM | POA: Insufficient documentation

## 2014-01-19 DIAGNOSIS — M79609 Pain in unspecified limb: Secondary | ICD-10-CM | POA: Insufficient documentation

## 2014-01-19 DIAGNOSIS — F17200 Nicotine dependence, unspecified, uncomplicated: Secondary | ICD-10-CM | POA: Insufficient documentation

## 2014-01-19 DIAGNOSIS — Z91040 Latex allergy status: Secondary | ICD-10-CM | POA: Insufficient documentation

## 2014-01-19 DIAGNOSIS — M79672 Pain in left foot: Secondary | ICD-10-CM

## 2014-01-19 DIAGNOSIS — Z79899 Other long term (current) drug therapy: Secondary | ICD-10-CM | POA: Insufficient documentation

## 2014-01-19 NOTE — ED Notes (Signed)
PA at bedside.

## 2014-01-19 NOTE — ED Notes (Signed)
Pt reports pain in left ankle and foot x 3 days.  No known injury.  Mild swelling and bruising to ankle.

## 2014-01-20 MED ORDER — HYDROCODONE-ACETAMINOPHEN 5-325 MG PO TABS
1.0000 | ORAL_TABLET | Freq: Once | ORAL | Status: AC
  Administered 2014-01-20: 1 via ORAL
  Filled 2014-01-20: qty 1

## 2014-01-20 MED ORDER — HYDROCODONE-ACETAMINOPHEN 5-325 MG PO TABS: 1.0000 | ORAL_TABLET | ORAL | Status: DC | PRN

## 2014-01-20 NOTE — Discharge Instructions (Signed)
Musculoskeletal Pain Musculoskeletal pain is muscle and boney aches and pains. These pains can occur in any part of the body. Your caregiver may treat you without knowing the cause of the pain. They may treat you if blood or urine tests, X-rays, and other tests were normal.  CAUSES There is often not a definite cause or reason for these pains. These pains may be caused by a type of germ (virus). The discomfort may also come from overuse. Overuse includes working out too hard when your body is not fit. Boney aches also come from weather changes. Bone is sensitive to atmospheric pressure changes. HOME CARE INSTRUCTIONS   Ask when your test results will be ready. Make sure you get your test results.  Only take over-the-counter or prescription medicines for pain, discomfort, or fever as directed by your caregiver. If you were given medications for your condition, do not drive, operate machinery or power tools, or sign legal documents for 24 hours. Do not drink alcohol. Do not take sleeping pills or other medications that may interfere with treatment.  Continue all activities unless the activities cause more pain. When the pain lessens, slowly resume normal activities. Gradually increase the intensity and duration of the activities or exercise.  During periods of severe pain, bed rest may be helpful. Lay or sit in any position that is comfortable.  Putting ice on the injured area.  Put ice in a bag.  Place a towel between your skin and the bag.  Leave the ice on for 15 to 20 minutes, 3 to 4 times a day.  Follow up with your caregiver for continued problems and no reason can be found for the pain. If the pain becomes worse or does not go away, it may be necessary to repeat tests or do additional testing. Your caregiver may need to look further for a possible cause. SEEK IMMEDIATE MEDICAL CARE IF:  You have pain that is getting worse and is not relieved by medications.  You develop chest pain  that is associated with shortness or breath, sweating, feeling sick to your stomach (nauseous), or throw up (vomit).  Your pain becomes localized to the abdomen.  You develop any new symptoms that seem different or that concern you. MAKE SURE YOU:   Understand these instructions.  Will watch your condition.  Will get help right away if you are not doing well or get worse. Document Released: 08/28/2005 Document Revised: 11/20/2011 Document Reviewed: 05/02/2013 [facility] Patient Information 2014 [place].  Use your crutches to minimize weight bearing. Elevation and ice may help with pain control.  Do not drive within 4 hours of taking hydrocodone as this will make you drowsy.  Your xrays are negative for acute injury today.

## 2014-01-21 NOTE — ED Provider Notes (Signed)
CSN: [id number]     Arrival date & time 01/19/14  2028 History   First MD Initiated Contact with Patient 01/19/14 2250     Chief Complaint  Patient presents with  . Foot Pain     (Consider location/radiation/quality/duration/timing/severity/associated sxs/prior Treatment) HPI Comments: Chelsea Davidson is a 33 y.o. Female presenting with left foot and ankle pain, described as burning pain which has been present for the past 3 days.  She denies injury, but endorses having a left ankle sprain 3 months ago, and felt generally healed from this injury until this week.  She has swelling and bruising at the site of pain.  She is able to bear weight although painful.  She has tried ibuprofen without relief.  She has no radiation of pain into her leg.     The history is provided by the patient.    Past Medical History  Diagnosis Date  . Polycystic ovaries    Past Surgical History  Procedure Laterality Date  . Cesarean section    . Appendectomy    . Carpal tunnel release     No family history on file. History  Substance Use Topics  . Smoking status: Current Every Day Smoker -- 1.00 packs/day    Types: Cigarettes  . Smokeless tobacco: Not on file  . Alcohol Use: No   OB History   Grav Para Term Preterm Abortions TAB SAB Ect Mult Living                 Review of Systems  Constitutional: Negative for fever.  Musculoskeletal: Positive for arthralgias. Negative for joint swelling and myalgias.  Skin: Negative for color change, rash and wound.  Neurological: Negative for weakness and numbness.      Allergies  Latex and Toradol  Home Medications   Prior to Admission medications   Medication Sig Start Date End Date Taking? Authorizing Provider  azithromycin (ZITHROMAX) 250 MG tablet Take 1 tablet (250 mg total) by mouth daily. Take first 2 tablets together, then 1 every day until finished. 08/13/13   Fransico Meadow, PA-C  clindamycin (CLEOCIN) 150 MG capsule Take 1 capsule (150 mg  total) by mouth every 6 (six) hours. 09/27/13   Hope Bunnie Pion, NP  HYDROcodone-acetaminophen (HYCET) 7.5-325 mg/15 ml solution Take 15 mLs by mouth 4 (four) times daily as needed for moderate pain. 09/22/13 09/22/14  Idalia Needle. [place], PA-C  HYDROcodone-acetaminophen (NORCO/VICODIN) 5-325 MG per tablet Take 2 tablets by mouth every 6 (six) hours as needed for moderate pain. 10/15/13   Charles B. Karle Starch, MD  HYDROcodone-acetaminophen (NORCO/VICODIN) 5-325 MG per tablet Take 1 tablet by mouth every 4 (four) hours as needed for moderate pain. 01/20/14   Evalee Jefferson, PA-C  HYDROcodone-homatropine (HYDROMET) 5-1.5 MG/5ML syrup Take 5 mLs by mouth every 6 (six) hours as needed for cough. 08/13/13   Fransico Meadow, PA-C  lidocaine (XYLOCAINE) 2 % solution Use as directed 20 mLs in the mouth or throat as needed for mouth pain. 09/27/13   Hope Bunnie Pion, NP   BP 144/78  Pulse 78  Temp(Src) 98.1 F (36.7 C) (Oral)  Resp 18  Ht 5\' 3"  (1.6 m)  Wt 211 lb (95.709 kg)  BMI 37.39 kg/m2  SpO2 100% Physical Exam  Nursing note and vitals reviewed. Constitutional: She appears well-developed and well-nourished.  HENT:  Head: Normocephalic.  Cardiovascular: Normal rate and intact distal pulses.  Exam reveals no decreased pulses.   Pulses:      Dorsalis  pedis pulses are 2+ on the right side, and 2+ on the left side.       Posterior tibial pulses are 2+ on the right side, and 2+ on the left side.  Musculoskeletal: She exhibits edema and tenderness.       Right ankle: She exhibits normal pulse.       Left foot: She exhibits tenderness. She exhibits no swelling, normal capillary refill, no crepitus and no deformity.  ttp along medial dorsal left foot along 1st metatarsal.  Distal sensation intact, less than 2 sec cap refill, dp pulse intact, no edema, trace of old appearing ecchymosis.  No joint effusion, edema or erythema.  Neurological: She is alert. No sensory deficit.  Skin: Skin is warm, dry and intact.    ED  Course  Procedures (including critical care time) Labs Review Labs Reviewed - No data to display  Imaging Review Dg Ankle Complete Left  01/19/2014   CLINICAL DATA:  Foot and ankle pain for 3 days.  EXAM: LEFT ANKLE COMPLETE - 3+ VIEW  COMPARISON:  DG ANKLE COMPLETE*L* dated 10/15/2013  FINDINGS: There is no evidence of fracture, dislocation, or joint effusion. There is no evidence of arthropathy or other focal bone abnormality. Soft tissues are unremarkable.  IMPRESSION: Negative.   Electronically Signed   By: Lucienne Capers M.D.   On: 01/19/2014 21:20   Dg Foot Complete Left  01/19/2014   CLINICAL DATA:  Pain.  EXAM: LEFT FOOT - COMPLETE 3+ VIEW  COMPARISON:  DG ANKLE COMPLETE*L* dated 01/19/2014; DG ANKLE COMPLETE*L* dated 10/15/2013  FINDINGS: There is no evidence of fracture or dislocation. There is no evidence of arthropathy or other focal bone abnormality. Soft tissues are unremarkable.  IMPRESSION: Negative.   Electronically Signed   By: Lucienne Capers M.D.   On: 01/19/2014 21:19     EKG Interpretation None      MDM   Final diagnoses:  Foot pain, left    Patients labs and/or radiological studies were viewed and considered during the medical decision making and disposition process.  Pt with foot pain of unclear etiology, recent ankle sprain.  Burning pain suggestive of neuropathy, no rash,  No proximal extremity pain.  She was encouraged elevation, crutch use prn (has from recent sprain),  Referral to podiatry for further eval if sx persist.  Negative xrays today.  Hydrocodone prescribed.  Also encouraged continued ibuprofen.  The patient appears reasonably screened and/or stabilized for discharge and I doubt any other medical condition or other [facility] requiring further screening, evaluation, or treatment in the ED at this time prior to discharge.      Evalee Jefferson, PA-C 01/21/14 1317

## 2014-01-22 NOTE — ED Provider Notes (Signed)
Medical screening examination/treatment/procedure(s) were performed by non-physician practitioner and as supervising physician I was immediately available for consultation/collaboration.   Houston Siren III, MD 01/22/14 [phone]

## 2014-02-01 ENCOUNTER — Encounter (HOSPITAL_BASED_OUTPATIENT_CLINIC_OR_DEPARTMENT_OTHER): Payer: Self-pay | Admitting: Emergency Medicine

## 2014-02-01 ENCOUNTER — Emergency Department (HOSPITAL_BASED_OUTPATIENT_CLINIC_OR_DEPARTMENT_OTHER): Payer: Self-pay

## 2014-02-01 ENCOUNTER — Emergency Department (HOSPITAL_BASED_OUTPATIENT_CLINIC_OR_DEPARTMENT_OTHER)
Admission: EM | Admit: 2014-02-01 | Discharge: 2014-02-02 | Disposition: A | Discharge status: Home / Self Care | Payer: Self-pay | Attending: Emergency Medicine | Admitting: Emergency Medicine

## 2014-02-01 DIAGNOSIS — R102 Pelvic and perineal pain: Secondary | ICD-10-CM

## 2014-02-01 DIAGNOSIS — Z792 Long term (current) use of antibiotics: Secondary | ICD-10-CM | POA: Insufficient documentation

## 2014-02-01 DIAGNOSIS — Z3202 Encounter for pregnancy test, result negative: Secondary | ICD-10-CM | POA: Insufficient documentation

## 2014-02-01 DIAGNOSIS — N390 Urinary tract infection, site not specified: Secondary | ICD-10-CM | POA: Insufficient documentation

## 2014-02-01 DIAGNOSIS — N949 Unspecified condition associated with female genital organs and menstrual cycle: Secondary | ICD-10-CM | POA: Insufficient documentation

## 2014-02-01 DIAGNOSIS — Z91040 Latex allergy status: Secondary | ICD-10-CM | POA: Insufficient documentation

## 2014-02-01 DIAGNOSIS — F17200 Nicotine dependence, unspecified, uncomplicated: Secondary | ICD-10-CM | POA: Insufficient documentation

## 2014-02-01 DIAGNOSIS — Z8742 Personal history of other diseases of the female genital tract: Secondary | ICD-10-CM | POA: Insufficient documentation

## 2014-02-01 HISTORY — DX: Benign neoplasm of connective and other soft tissue, unspecified: D21.9

## 2014-02-01 LAB — WET PREP, GENITAL
CLUE CELLS WET PREP: NONE SEEN
TRICH WET PREP: NONE SEEN

## 2014-02-01 LAB — CBC WITH DIFFERENTIAL/PLATELET
Basophils Absolute: 0 10*3/uL (ref 0.0–0.1)
Basophils Relative: 0 % (ref 0–1)
EOS ABS: 0.2 10*3/uL (ref 0.0–0.7)
Eosinophils Relative: 1 % (ref 0–5)
HCT: 39.2 % (ref 36.0–46.0)
Hemoglobin: 13.7 g/dL (ref 12.0–15.0)
Lymphocytes Relative: 21 % (ref 12–46)
Lymphs Abs: 2.6 10*3/uL (ref 0.7–4.0)
MCH: 31.2 pg (ref 26.0–34.0)
MCHC: 34.9 g/dL (ref 30.0–36.0)
MCV: 89.3 fL (ref 78.0–100.0)
Monocytes Absolute: 0.6 10*3/uL (ref 0.1–1.0)
Monocytes Relative: 5 % (ref 3–12)
NEUTROS PCT: 73 % (ref 43–77)
Neutro Abs: 9 10*3/uL — ABNORMAL HIGH (ref 1.7–7.7)
PLATELETS: 274 10*3/uL (ref 150–400)
RBC: 4.39 MIL/uL (ref 3.87–5.11)
RDW: 13.6 % (ref 11.5–15.5)
WBC: 12.3 10*3/uL — ABNORMAL HIGH (ref 4.0–10.5)

## 2014-02-01 LAB — COMPREHENSIVE METABOLIC PANEL
ALBUMIN: 4 g/dL (ref 3.5–5.2)
ALT: 13 U/L (ref 0–35)
AST: 12 U/L (ref 0–37)
Alkaline Phosphatase: 53 U/L (ref 39–117)
BUN: 12 mg/dL (ref 6–23)
CO2: 22 mEq/L (ref 19–32)
Calcium: 9.5 mg/dL (ref 8.4–10.5)
Chloride: 102 mEq/L (ref 96–112)
Creatinine, Ser: 0.7 mg/dL (ref 0.50–1.10)
GFR calc non Af Amer: >90 mL/min (ref >90)
Glucose, Bld: 136 mg/dL — ABNORMAL HIGH (ref 70–99)
POTASSIUM: 3.6 meq/L — AB (ref 3.7–5.3)
SODIUM: 141 meq/L (ref 137–147)
TOTAL PROTEIN: 7.5 g/dL (ref 6.0–8.3)
Total Bilirubin: <0.2 mg/dL — ABNORMAL LOW (ref 0.3–1.2)

## 2014-02-01 LAB — URINALYSIS, ROUTINE W REFLEX MICROSCOPIC
BILIRUBIN URINE: NEGATIVE
Glucose, UA: NEGATIVE mg/dL
KETONES UR: NEGATIVE mg/dL
Leukocytes, UA: NEGATIVE
NITRITE: NEGATIVE
PROTEIN: NEGATIVE mg/dL
SPECIFIC GRAVITY, URINE: 1.022 (ref 1.005–1.030)
UROBILINOGEN UA: 0.2 mg/dL (ref 0.0–1.0)
pH: 6 (ref 5.0–8.0)

## 2014-02-01 LAB — PREGNANCY, URINE: Preg Test, Ur: NEGATIVE

## 2014-02-01 LAB — URINE MICROSCOPIC-ADD ON

## 2014-02-01 MED ORDER — ONDANSETRON HCL 4 MG/2ML IJ SOLN
4.0000 mg | Freq: Once | INTRAMUSCULAR | Status: AC
  Administered 2014-02-01: 4 mg via INTRAVENOUS
  Filled 2014-02-01: qty 2

## 2014-02-01 MED ORDER — MORPHINE SULFATE 4 MG/ML IJ SOLN
4.0000 mg | Freq: Once | INTRAMUSCULAR | Status: AC
  Administered 2014-02-01: 4 mg via INTRAVENOUS
  Filled 2014-02-01: qty 1

## 2014-02-01 MED ORDER — IOHEXOL 300 MG/ML  SOLN
50.0000 mL | Freq: Once | INTRAMUSCULAR | Status: AC | PRN
  Administered 2014-02-01: 50 mL via ORAL

## 2014-02-01 MED ORDER — IOHEXOL 300 MG/ML  SOLN
100.0000 mL | Freq: Once | INTRAMUSCULAR | Status: AC | PRN
  Administered 2014-02-01: 100 mL via INTRAVENOUS

## 2014-02-01 NOTE — ED Provider Notes (Signed)
CSN: [id number]     Arrival date & time 02/01/14  2004 History   First MD Initiated Contact with Patient 02/01/14 2134     Chief Complaint  Patient presents with  . Pelvic Pain     (Consider location/radiation/quality/duration/timing/severity/associated sxs/prior Treatment) HPI Comments: Pt complains of one day of BL low back pain radiating to BL LQ with n/v, and inc clear vaginal d/c.   Patient is a 33 y.o. female presenting with pelvic pain. The history is provided by the patient and a friend. No language interpreter was used.  Pelvic Pain This is a new problem. The current episode started yesterday. The problem occurs constantly. The problem has been gradually worsening. Associated symptoms include abdominal pain. Pertinent negatives include no chest pain, no headaches and no shortness of breath. Nothing aggravates the symptoms. Nothing relieves the symptoms. She has tried nothing for the symptoms. The treatment provided no relief.    Past Medical History  Diagnosis Date  . Polycystic ovaries   . Fibroid    Past Surgical History  Procedure Laterality Date  . Cesarean section    . Appendectomy    . Carpal tunnel release    . Ulnar nerve repair    . Radial nerve    . Ganglion cyst excision     No family history on file. History  Substance Use Topics  . Smoking status: Current Every Day Smoker -- 1.00 packs/day    Types: Cigarettes  . Smokeless tobacco: Not on file  . Alcohol Use: No   OB History   Grav Para Term Preterm Abortions TAB SAB Ect Mult Living                 Review of Systems  Constitutional: Negative for fever, chills, diaphoresis, activity change, appetite change and fatigue.  HENT: Negative for congestion, facial swelling, rhinorrhea and sore throat.   Eyes: Negative for photophobia and discharge.  Respiratory: Negative for cough, chest tightness and shortness of breath.   Cardiovascular: Negative for chest pain, palpitations and leg swelling.   Gastrointestinal: Positive for abdominal pain. Negative for nausea, vomiting and diarrhea.  Endocrine: Negative for polydipsia and polyuria.  Genitourinary: Positive for urgency, vaginal discharge and pelvic pain. Negative for dysuria, frequency and difficulty urinating.  Musculoskeletal: Positive for back pain. Negative for arthralgias, neck pain and neck stiffness.  Skin: Negative for color change and wound.  Allergic/Immunologic: Negative for immunocompromised state.  Neurological: Negative for facial asymmetry, weakness, numbness and headaches.  Hematological: Does not bruise/bleed easily.  Psychiatric/Behavioral: Negative for confusion and agitation.      Allergies  Latex and Toradol  Home Medications   Prior to Admission medications   Medication Sig Start Date End Date Taking? Authorizing Provider  azithromycin (ZITHROMAX) 250 MG tablet Take 1 tablet (250 mg total) by mouth daily. Take first 2 tablets together, then 1 every day until finished. 08/13/13   Fransico Meadow, PA-C  clindamycin (CLEOCIN) 150 MG capsule Take 1 capsule (150 mg total) by mouth every 6 (six) hours. 09/27/13   Hope Bunnie Pion, NP  HYDROcodone-acetaminophen (HYCET) 7.5-325 mg/15 ml solution Take 15 mLs by mouth 4 (four) times daily as needed for moderate pain. 09/22/13 09/22/14  Idalia Needle. Sanford, PA-C  HYDROcodone-acetaminophen (NORCO/VICODIN) 5-325 MG per tablet Take 2 tablets by mouth every 6 (six) hours as needed for moderate pain. 10/15/13   Charles B. Karle Starch, MD  HYDROcodone-acetaminophen (NORCO/VICODIN) 5-325 MG per tablet Take 1 tablet by mouth every 4 (four) hours as  needed for moderate pain. 01/20/14   Evalee Jefferson, PA-C  HYDROcodone-homatropine (HYDROMET) 5-1.5 MG/5ML syrup Take 5 mLs by mouth every 6 (six) hours as needed for cough. 08/13/13   Fransico Meadow, PA-C  lidocaine (XYLOCAINE) 2 % solution Use as directed 20 mLs in the mouth or throat as needed for mouth pain. 09/27/13   Hope Bunnie Pion, NP   BP 126/59   Pulse 78  Temp(Src) 98.6 F (37 C) (Oral)  Resp 17  Ht 5\' 3"  (1.6 m)  Wt 213 lb (96.616 kg)  BMI 37.74 kg/m2  SpO2 99% Physical Exam  Constitutional: She is oriented to person, place, and time. She appears well-developed and well-nourished. No distress.  HENT:  Head: Normocephalic and atraumatic.  Mouth/Throat: No oropharyngeal exudate.  Eyes: Pupils are equal, round, and reactive to light.  Neck: Normal range of motion. Neck supple.  Cardiovascular: Normal rate, regular rhythm and normal heart sounds.  Exam reveals no gallop and no friction rub.   No murmur heard. Pulmonary/Chest: Effort normal and breath sounds normal. No respiratory distress. She has no wheezes. She has no rales.  Abdominal: Soft. Bowel sounds are normal. She exhibits no distension and no mass. There is tenderness in the right lower quadrant, suprapubic area and left lower quadrant. There is CVA tenderness (BL). There is no rigidity, no rebound and no guarding.  Musculoskeletal: Normal range of motion. She exhibits no edema and no tenderness.  Neurological: She is alert and oriented to person, place, and time.  Skin: Skin is warm and dry.  Psychiatric: She has a normal mood and affect.    ED Course  Procedures (including critical care time) Labs Review Labs Reviewed  WET PREP, GENITAL - Abnormal; Notable for the following:    Yeast Wet Prep HPF POC FEW (*)    WBC, Wet Prep HPF POC FEW (*)    All other components within normal limits  URINALYSIS, ROUTINE W REFLEX MICROSCOPIC - Abnormal; Notable for the following:    Hgb urine dipstick TRACE (*)    All other components within normal limits  URINE MICROSCOPIC-ADD ON - Abnormal; Notable for the following:    Squamous Epithelial / LPF FEW (*)    Bacteria, UA MANY (*)    All other components within normal limits  CBC WITH DIFFERENTIAL - Abnormal; Notable for the following:    WBC 12.3 (*)    Neutro Abs 9.0 (*)    All other components within normal limits   COMPREHENSIVE METABOLIC PANEL - Abnormal; Notable for the following:    Potassium 3.6 (*)    Glucose, Bld 136 (*)    Total Bilirubin <0.2 (*)    All other components within normal limits  GC/CHLAMYDIA PROBE AMP  PREGNANCY, URINE    Imaging Review Ct Abdomen Pelvis W Contrast  02/02/2014   CLINICAL DATA:  Nausea, vomiting, pelvic pain and lower back pain.  EXAM: CT ABDOMEN AND PELVIS WITH CONTRAST  TECHNIQUE: Multidetector CT imaging of the abdomen and pelvis was performed using the standard protocol following bolus administration of intravenous contrast.  CONTRAST:  65mL OMNIPAQUE IOHEXOL 300 MG/ML SOLN, 148mL OMNIPAQUE IOHEXOL 300 MG/ML SOLN  COMPARISON:  Pelvic ultrasound performed 11/09/2013  FINDINGS: The visualized lung bases are clear.  A 6 mm hypodensity at the medial right hepatic lobe is nonspecific but may reflect a tiny cyst. The spleen is unremarkable in appearance. The gallbladder is within normal limits. The pancreas and left adrenal gland are unremarkable. A 4.3 x 2.1  cm mildly lobulated mass at the right adrenal gland is thought to reflect two adjacent adrenal adenomas, given relatively low attenuation on postcontrast images.  The kidneys are unremarkable in appearance. There is no evidence of hydronephrosis. No renal or ureteral stones are seen. No perinephric stranding is appreciated.  No free fluid is identified. The small bowel is unremarkable in appearance. The stomach is within normal limits. No acute vascular abnormalities are seen.  The patient is status post appendectomy. The colon is unremarkable in appearance.  The bladder is mildly distended and grossly unremarkable in appearance. The uterus is within normal limits. The ovaries are relatively symmetric, though mildly enlarged, corresponding to the patient's history of polycystic ovaries; no suspicious adnexal masses are seen. No inguinal lymphadenopathy is seen.  No acute osseous abnormalities are identified.  IMPRESSION: 1.  No acute abnormality seen to explain the patient's symptoms. 2. 4.3 x 2.1 cm mildly lobulated mass of the right adrenal gland is thought to reflect two adjacent adrenal adenomas, given relatively low attenuation on postcontrast images. Would correlate with lab findings. 3. Mild prominence of the ovaries corresponds to the patient's history of polycystic ovaries. 4. Question of tiny hepatic cyst.   Electronically Signed   By: Garald Balding M.D.   On: 02/02/2014 00:19     EKG Interpretation None      MDM   Final diagnoses:  Pelvic pain  UTI (lower urinary tract infection)    Pt is a 33 y.o. female with Pmhx as above who presents with low back pain, pelvic pain, n/v, urinary urgency, and inc clear vaginal d/c since yesterday. Pt has not had intercourse for 1 year, has hx of appendectomy. No hx of UTI, kidney stones.  On PE, VSS, pt in NAD though appears uncomfortable. +BL CVA tenderness. +BLLQ and suprapubic tenderness w/o rebound or guarding. +CMT and mild R adnexal tenderness on pelvic exam. Pt has had 7 ED vists in past 6 months, but no for GI/GI symptoms. W/u showed mild WBC elevated, Urine w/ many bacteria, CT w/o acute findings to explain pain, though does have adrenal cysts that have requested pt have f/u CT for in 6 months. Will treat for UTI, first dose rocephin given here. Will d/c home with PO bactrim. GC/CHlam pending. Return precautions given for new or worsening symptoms including worsening pain, fever, inability to tolerate PO.     Neta Ehlers, MD 02/02/14 [phone]

## 2014-02-01 NOTE — ED Notes (Signed)
Pt reports urinary urgency, n/v and pelvic/lower back pain since yesterday.  Denies dysuria.  Reports clear discharge.

## 2014-02-01 NOTE — ED Notes (Signed)
MD at bedside. 

## 2014-02-01 NOTE — ED Notes (Signed)
Pt reports n/v/d. Sts pain started yesterday with bilateral flank pain.

## 2014-02-02 LAB — GC/CHLAMYDIA PROBE AMP
CT Probe RNA: NEGATIVE
GC PROBE AMP APTIMA: NEGATIVE

## 2014-02-02 MED ORDER — ONDANSETRON HCL 4 MG/2ML IJ SOLN
INTRAMUSCULAR | Status: AC
  Filled 2014-02-02: qty 2

## 2014-02-02 MED ORDER — CEFTRIAXONE SODIUM 1 G IJ SOLR
INTRAMUSCULAR | Status: AC
  Filled 2014-02-02: qty 10

## 2014-02-02 MED ORDER — ONDANSETRON HCL 4 MG/2ML IJ SOLN
4.0000 mg | Freq: Once | INTRAMUSCULAR | Status: AC
  Administered 2014-02-02: 4 mg via INTRAVENOUS

## 2014-02-02 MED ORDER — DEXTROSE 5 % IV SOLN
1.0000 g | Freq: Once | INTRAVENOUS | Status: AC
  Administered 2014-02-02: 1 g via INTRAVENOUS

## 2014-02-02 MED ORDER — OXYCODONE-ACETAMINOPHEN 5-325 MG PO TABS: 1.0000 | ORAL_TABLET | Freq: Four times a day (QID) | ORAL | Status: DC | PRN

## 2014-02-02 MED ORDER — SULFAMETHOXAZOLE-TRIMETHOPRIM 800-160 MG PO TABS: 1.0000 | ORAL_TABLET | Freq: Two times a day (BID) | ORAL | Status: AC

## 2014-02-02 MED ORDER — MORPHINE SULFATE 4 MG/ML IJ SOLN
4.0000 mg | Freq: Once | INTRAMUSCULAR | Status: AC
Start: 2014-02-02 — End: 2014-02-02
  Administered 2014-02-02: 4 mg via INTRAVENOUS
  Filled 2014-02-02: qty 1

## 2014-02-02 NOTE — Discharge Instructions (Signed)
Pelvic Pain, Female °Female pelvic pain can be caused by many different things and start from a variety of places. Pelvic pain refers to pain that is located in the lower half of the abdomen and between your hips. The pain may occur over a short period of time (acute) or may be reoccurring (chronic). The cause of pelvic pain may be related to disorders affecting the female reproductive organs (gynecologic), but it may also be related to the bladder, kidney stones, an intestinal complication, or muscle or skeletal problems. Getting help right away for pelvic pain is important, especially if there has been severe, sharp, or a sudden onset of unusual pain. It is also important to get help right away because some types of pelvic pain can be life threatening.  °CAUSES  °Below are only some of the causes of pelvic pain. The causes of pelvic pain can be in one of several categories.  °· Gynecologic. °· Pelvic inflammatory disease. °· Sexually transmitted infection. °· Ovarian cyst or a twisted ovarian ligament (ovarian torsion). °· Uterine lining that grows outside the uterus (endometriosis). °· Fibroids, cysts, or tumors. °· Ovulation. °· Pregnancy. °· Pregnancy that occurs outside the uterus (ectopic pregnancy). °· Miscarriage. °· Labor. °· Abruption of the placenta or ruptured uterus. °· Infection. °· Uterine infection (endometritis). °· Bladder infection. °· Diverticulitis. °· Miscarriage related to a uterine infection (septic abortion). °· Bladder. °· Inflammation of the bladder (cystitis). °· Kidney stone(s). °· Gastrointenstinal. °· Constipation. °· Diverticulitis. °· Neurologic. °· Trauma. °· Feeling pelvic pain because of mental or emotional causes (psychosomatic). °· Cancers of the bowel or pelvis. °EVALUATION  °Your caregiver will want to take a careful history of your concerns. This includes recent changes in your health, a careful gynecologic history of your periods (menses), and a sexual history. Obtaining  your family history and medical history is also important. Your caregiver may suggest a pelvic exam. A pelvic exam will help identify the location and severity of the pain. It also helps in the evaluation of which organ system may be involved. In order to identify the cause of the pelvic pain and be properly treated, your caregiver may order tests. These tests may include:  °· A pregnancy test. °· Pelvic ultrasonography. °· An X-ray exam of the abdomen. °· A urinalysis or evaluation of vaginal discharge. °· Blood tests. °HOME CARE INSTRUCTIONS  °· Only take over-the-counter or prescription medicines for pain, discomfort, or fever as directed by your caregiver.   °· Rest as directed by your caregiver.   °· Eat a balanced diet.   °· Drink enough fluids to make your urine clear or pale yellow, or as directed.   °· Avoid sexual intercourse if it causes pain.   °· Apply warm or cold compresses to the lower abdomen depending on which one helps the pain.   °· Avoid stressful situations.   °· Keep a journal of your pelvic pain. Write down when it started, where the pain is located, and if there are things that seem to be associated with the pain, such as food or your menstrual cycle. °· Follow up with your caregiver as directed.   °SEEK MEDICAL CARE IF: °· Your medicine does not help your pain. °· You have abnormal vaginal discharge. °SEEK IMMEDIATE MEDICAL CARE IF:  °· You have heavy bleeding from the vagina.   °· Your pelvic pain increases.   °· You feel lightheaded or faint.   °· You have chills.   °· You have pain with urination or blood in your urine.   °· You have uncontrolled   diarrhea or vomiting.   °· You have a fever or persistent symptoms for more than 3 days. °· You have a fever and your symptoms suddenly get worse.   °· You are being physically or sexually abused.   °MAKE SURE YOU: °· Understand these instructions. °· Will watch your condition. °· Will get help if you are not doing well or get worse. °Document  Released: 07/25/2004 Document Revised: 02/27/2012 Document Reviewed: 12/18/2011 °ExitCare® Patient Information ©2014 [employer]. ° °Urinary Tract Infection °Urinary tract infections (UTIs) can develop anywhere along your urinary tract. Your urinary tract is your body's drainage system for removing wastes and extra water. Your urinary tract includes two kidneys, two ureters, a bladder, and a urethra. Your kidneys are a pair of bean-shaped organs. Each kidney is about the size of your fist. They are located below your ribs, one on each side of your spine. °CAUSES °Infections are caused by microbes, which are microscopic organisms, including fungi, viruses, and bacteria. These organisms are so small that they can only be seen through a microscope. Bacteria are the microbes that most commonly cause UTIs. °SYMPTOMS  °Symptoms of UTIs may vary by age and gender of the patient and by the location of the infection. Symptoms in young women typically include a frequent and intense urge to urinate and a painful, burning feeling in the bladder or urethra during urination. Older women and men are more likely to be tired, shaky, and weak and have muscle aches and abdominal pain. A fever may mean the infection is in your kidneys. Other symptoms of a kidney infection include pain in your back or sides below the ribs, nausea, and vomiting. °DIAGNOSIS °To diagnose a UTI, your caregiver will ask you about your symptoms. Your caregiver also will ask to provide a urine sample. The urine sample will be tested for bacteria and white blood cells. White blood cells are made by your body to help fight infection. °TREATMENT  °Typically, UTIs can be treated with medication. Because most UTIs are caused by a bacterial infection, they usually can be treated with the use of antibiotics. The choice of antibiotic and length of treatment depend on your symptoms and the type of bacteria causing your infection. °HOME CARE INSTRUCTIONS °· If you  were prescribed antibiotics, take them exactly as your caregiver instructs you. Finish the medication even if you feel better after you have only taken some of the medication. °· Drink enough water and fluids to keep your urine clear or pale yellow. °· Avoid caffeine, tea, and carbonated beverages. They tend to irritate your bladder. °· Empty your bladder often. Avoid holding urine for long periods of time. °· Empty your bladder before and after sexual intercourse. °· After a bowel movement, women should cleanse from front to back. Use each tissue only once. °SEEK MEDICAL CARE IF:  °· You have back pain. °· You develop a fever. °· Your symptoms do not begin to resolve within 3 days. °SEEK IMMEDIATE MEDICAL CARE IF:  °· You have severe back pain or lower abdominal pain. °· You develop chills. °· You have nausea or vomiting. °· You have continued burning or discomfort with urination. °MAKE SURE YOU:  °· Understand these instructions. °· Will watch your condition. °· Will get help right away if you are not doing well or get worse. °Document Released: 06/07/2005 Document Revised: 02/27/2012 Document Reviewed: 10/06/2011 °ExitCare® Patient Information ©2014 [employer]. ° °

## 2014-02-02 NOTE — ED Notes (Signed)
Pt tolerating PO fluids with no N/V/D noted.

## 2014-02-02 NOTE — ED Notes (Signed)
Assumed care of patient from Ashley, RN.

## 2014-03-07 ENCOUNTER — Emergency Department (HOSPITAL_BASED_OUTPATIENT_CLINIC_OR_DEPARTMENT_OTHER)
Admission: EM | Admit: 2014-03-07 | Discharge: 2014-03-07 | Disposition: A | Discharge status: Home / Self Care | Payer: Self-pay | Attending: Emergency Medicine | Admitting: Emergency Medicine

## 2014-03-07 ENCOUNTER — Encounter (HOSPITAL_BASED_OUTPATIENT_CLINIC_OR_DEPARTMENT_OTHER): Payer: Self-pay | Admitting: Emergency Medicine

## 2014-03-07 DIAGNOSIS — F17200 Nicotine dependence, unspecified, uncomplicated: Secondary | ICD-10-CM | POA: Insufficient documentation

## 2014-03-07 DIAGNOSIS — Z3202 Encounter for pregnancy test, result negative: Secondary | ICD-10-CM | POA: Insufficient documentation

## 2014-03-07 DIAGNOSIS — Z79899 Other long term (current) drug therapy: Secondary | ICD-10-CM | POA: Insufficient documentation

## 2014-03-07 DIAGNOSIS — Z8719 Personal history of other diseases of the digestive system: Secondary | ICD-10-CM | POA: Insufficient documentation

## 2014-03-07 DIAGNOSIS — R300 Dysuria: Secondary | ICD-10-CM | POA: Insufficient documentation

## 2014-03-07 DIAGNOSIS — Z8742 Personal history of other diseases of the female genital tract: Secondary | ICD-10-CM | POA: Insufficient documentation

## 2014-03-07 DIAGNOSIS — N898 Other specified noninflammatory disorders of vagina: Secondary | ICD-10-CM | POA: Insufficient documentation

## 2014-03-07 DIAGNOSIS — Z91040 Latex allergy status: Secondary | ICD-10-CM | POA: Insufficient documentation

## 2014-03-07 DIAGNOSIS — E669 Obesity, unspecified: Secondary | ICD-10-CM | POA: Insufficient documentation

## 2014-03-07 DIAGNOSIS — R102 Pelvic and perineal pain: Secondary | ICD-10-CM

## 2014-03-07 DIAGNOSIS — N949 Unspecified condition associated with female genital organs and menstrual cycle: Secondary | ICD-10-CM | POA: Insufficient documentation

## 2014-03-07 HISTORY — DX: Disease of gallbladder, unspecified: K82.9

## 2014-03-07 HISTORY — DX: Obesity, unspecified: E66.9

## 2014-03-07 LAB — URINALYSIS, ROUTINE W REFLEX MICROSCOPIC
BILIRUBIN URINE: NEGATIVE
Glucose, UA: NEGATIVE mg/dL
Hgb urine dipstick: NEGATIVE
Ketones, ur: NEGATIVE mg/dL
Leukocytes, UA: NEGATIVE
Nitrite: NEGATIVE
Protein, ur: NEGATIVE mg/dL
Specific Gravity, Urine: 1.012 (ref 1.005–1.030)
UROBILINOGEN UA: 0.2 mg/dL (ref 0.0–1.0)
pH: 6 (ref 5.0–8.0)

## 2014-03-07 LAB — WET PREP, GENITAL
Clue Cells Wet Prep HPF POC: NONE SEEN
Trich, Wet Prep: NONE SEEN
Yeast Wet Prep HPF POC: NONE SEEN

## 2014-03-07 LAB — PREGNANCY, URINE: PREG TEST UR: NEGATIVE

## 2014-03-07 MED ORDER — OXYCODONE-ACETAMINOPHEN 5-325 MG PO TABS
1.0000 | ORAL_TABLET | Freq: Once | ORAL | Status: AC
  Administered 2014-03-07: 1 via ORAL
  Filled 2014-03-07: qty 1

## 2014-03-07 MED ORDER — PHENAZOPYRIDINE HCL 200 MG PO TABS: 200.0000 mg | ORAL_TABLET | Freq: Three times a day (TID) | ORAL | Status: AC

## 2014-03-07 MED ORDER — IBUPROFEN 800 MG PO TABS
800.0000 mg | ORAL_TABLET | Freq: Once | ORAL | Status: AC
Start: 2014-03-07 — End: 2014-03-07
  Administered 2014-03-07: 800 mg via ORAL
  Filled 2014-03-07: qty 1

## 2014-03-07 MED ORDER — PHENAZOPYRIDINE HCL 100 MG PO TABS
200.0000 mg | ORAL_TABLET | Freq: Once | ORAL | Status: AC
  Administered 2014-03-07: 200 mg via ORAL
  Filled 2014-03-07: qty 2

## 2014-03-07 MED ORDER — HYDROCODONE-ACETAMINOPHEN 5-325 MG PO TABS: 2.0000 | ORAL_TABLET | Freq: Four times a day (QID) | ORAL | Status: DC | PRN

## 2014-03-07 NOTE — ED Notes (Signed)
Pt having vaginal swelling and pain.  Dysuria.  Suprapubic pain radiating to back on both sides.

## 2014-03-07 NOTE — ED Provider Notes (Signed)
CSN: [id number]     Arrival date & time 03/07/14  0218 History   First MD Initiated Contact with Patient 03/07/14 0253     Chief Complaint  Patient presents with  . Vaginal Pain     (Consider location/radiation/quality/duration/timing/severity/associated sxs/prior Treatment) Patient is a 33 y.o. female presenting with vaginal pain. The history is provided by the patient.  Vaginal Pain This is a recurrent problem. The current episode started more than 2 days ago. The problem occurs constantly. The problem has not changed since onset.Pertinent negatives include no chest pain, no headaches and no shortness of breath. Nothing aggravates the symptoms. Nothing relieves the symptoms. She has tried nothing for the symptoms. The treatment provided no relief.  Dysuria and discharge.    Past Medical History  Diagnosis Date  . Polycystic ovaries   . Fibroid   . Gall bladder disease   . Obesity    Past Surgical History  Procedure Laterality Date  . Cesarean section    . Appendectomy    . Carpal tunnel release    . Ulnar nerve repair    . Radial nerve    . Ganglion cyst excision     No family history on file. History  Substance Use Topics  . Smoking status: Current Every Day Smoker -- 1.00 packs/day    Types: Cigarettes  . Smokeless tobacco: Not on file  . Alcohol Use: No   OB History   Grav Para Term Preterm Abortions TAB SAB Ect Mult Living                 Review of Systems  Constitutional: Negative for fever.  Respiratory: Negative for shortness of breath.   Cardiovascular: Negative for chest pain.  Genitourinary: Positive for dysuria, vaginal discharge and vaginal pain. Negative for vaginal bleeding.  Neurological: Negative for headaches.  All other systems reviewed and are negative.     Allergies  Latex and Toradol  Home Medications   Prior to Admission medications   Medication Sig Start Date End Date Taking? Authorizing Provider  azithromycin (ZITHROMAX) 250 MG  tablet Take 1 tablet (250 mg total) by mouth daily. Take first 2 tablets together, then 1 every day until finished. 08/13/13   Fransico Meadow, PA-C  clindamycin (CLEOCIN) 150 MG capsule Take 1 capsule (150 mg total) by mouth every 6 (six) hours. 09/27/13   Hope Bunnie Pion, NP  HYDROcodone-acetaminophen (HYCET) 7.5-325 mg/15 ml solution Take 15 mLs by mouth 4 (four) times daily as needed for moderate pain. 09/22/13 09/22/14  Idalia Needle. [place], PA-C  HYDROcodone-acetaminophen (NORCO/VICODIN) 5-325 MG per tablet Take 2 tablets by mouth every 6 (six) hours as needed for moderate pain. 10/15/13   Charles B. Karle Starch, MD  HYDROcodone-acetaminophen (NORCO/VICODIN) 5-325 MG per tablet Take 1 tablet by mouth every 4 (four) hours as needed for moderate pain. 01/20/14   Evalee Jefferson, PA-C  HYDROcodone-homatropine (HYDROMET) 5-1.5 MG/5ML syrup Take 5 mLs by mouth every 6 (six) hours as needed for cough. 08/13/13   Fransico Meadow, PA-C  lidocaine (XYLOCAINE) 2 % solution Use as directed 20 mLs in the mouth or throat as needed for mouth pain. 09/27/13   [place], NP  oxyCODONE-acetaminophen (PERCOCET/ROXICET) 5-325 MG per tablet Take 1 tablet by mouth every 6 (six) hours as needed for severe pain. 02/02/14   Neta Ehlers, MD  sulfamethoxazole-trimethoprim (SEPTRA DS) 800-160 MG per tablet Take 1 tablet by mouth 2 (two) times daily. 02/02/14   Neta Ehlers, MD  BP 137/82  Pulse 86  Temp(Src) 99.1 F (37.3 C) (Oral)  Resp 16  Ht 5\' 3"  (1.6 m)  Wt 211 lb (95.709 kg)  BMI 37.39 kg/m2  SpO2 97%  LMP 01/09/2014 Physical Exam  Constitutional: She is oriented to person, place, and time. She appears well-developed and well-nourished. No distress.  HENT:  Head: Normocephalic and atraumatic.  Mouth/Throat: Oropharynx is clear and moist.  Eyes: Conjunctivae are normal. Pupils are equal, round, and reactive to light.  Neck: Normal range of motion. Neck supple.  Cardiovascular: Normal rate, regular rhythm and intact  distal pulses.   Pulmonary/Chest: Effort normal and breath sounds normal. She has no wheezes. She has no rales.  Abdominal: Soft. Bowel sounds are normal. There is no tenderness. There is no rebound and no guarding.  Genitourinary: Vaginal discharge found.  No cmt no adnexal tenderness or masses.  Chaperone present  Musculoskeletal: Normal range of motion.  Neurological: She is alert and oriented to person, place, and time.  Skin: Skin is warm and dry.  Psychiatric: She has a normal mood and affect.    ED Course  Procedures (including critical care time) Labs Review Labs Reviewed  WET PREP, GENITAL  GC/CHLAMYDIA PROBE AMP  URINALYSIS, ROUTINE W REFLEX MICROSCOPIC  PREGNANCY, URINE    Imaging Review No results found.   EKG Interpretation None      MDM   Final diagnoses:  None    Will give pain medication.  Follow up with your gynecologist for pap smear and additional testing    April K Palumbo-Rasch, MD 03/07/14 3888

## 2014-03-09 LAB — GC/CHLAMYDIA PROBE AMP
CT PROBE, AMP APTIMA: NEGATIVE
GC PROBE AMP APTIMA: NEGATIVE

## 2014-03-11 ENCOUNTER — Ambulatory Visit: Payer: Self-pay

## 2014-07-08 ENCOUNTER — Emergency Department (HOSPITAL_BASED_OUTPATIENT_CLINIC_OR_DEPARTMENT_OTHER)
Admission: EM | Admit: 2014-07-08 | Discharge: 2014-07-08 | Disposition: A | Discharge status: Home / Self Care | Payer: Self-pay | Attending: Emergency Medicine | Admitting: Emergency Medicine

## 2014-07-08 ENCOUNTER — Encounter (HOSPITAL_BASED_OUTPATIENT_CLINIC_OR_DEPARTMENT_OTHER): Payer: Self-pay | Admitting: Emergency Medicine

## 2014-07-08 ENCOUNTER — Emergency Department (HOSPITAL_BASED_OUTPATIENT_CLINIC_OR_DEPARTMENT_OTHER): Payer: Self-pay

## 2014-07-08 DIAGNOSIS — E669 Obesity, unspecified: Secondary | ICD-10-CM | POA: Insufficient documentation

## 2014-07-08 DIAGNOSIS — R05 Cough: Secondary | ICD-10-CM

## 2014-07-08 DIAGNOSIS — R51 Headache: Secondary | ICD-10-CM | POA: Insufficient documentation

## 2014-07-08 DIAGNOSIS — Z720 Tobacco use: Secondary | ICD-10-CM | POA: Insufficient documentation

## 2014-07-08 DIAGNOSIS — J069 Acute upper respiratory infection, unspecified: Secondary | ICD-10-CM | POA: Insufficient documentation

## 2014-07-08 DIAGNOSIS — Z8639 Personal history of other endocrine, nutritional and metabolic disease: Secondary | ICD-10-CM | POA: Insufficient documentation

## 2014-07-08 DIAGNOSIS — Z87448 Personal history of other diseases of urinary system: Secondary | ICD-10-CM | POA: Insufficient documentation

## 2014-07-08 DIAGNOSIS — Z8719 Personal history of other diseases of the digestive system: Secondary | ICD-10-CM | POA: Insufficient documentation

## 2014-07-08 DIAGNOSIS — Z792 Long term (current) use of antibiotics: Secondary | ICD-10-CM | POA: Insufficient documentation

## 2014-07-08 DIAGNOSIS — R059 Cough, unspecified: Secondary | ICD-10-CM

## 2014-07-08 DIAGNOSIS — Z91040 Latex allergy status: Secondary | ICD-10-CM | POA: Insufficient documentation

## 2014-07-08 DIAGNOSIS — H9209 Otalgia, unspecified ear: Secondary | ICD-10-CM | POA: Insufficient documentation

## 2014-07-08 LAB — BASIC METABOLIC PANEL
Anion gap: 14 (ref 5–15)
BUN: 7 mg/dL (ref 6–23)
CO2: 25 mEq/L (ref 19–32)
Calcium: 9.5 mg/dL (ref 8.4–10.5)
Chloride: 103 mEq/L (ref 96–112)
Creatinine, Ser: 0.7 mg/dL (ref 0.50–1.10)
GFR calc Af Amer: >90 mL/min (ref >90)
Glucose, Bld: 88 mg/dL (ref 70–99)
Potassium: 4.4 mEq/L (ref 3.7–5.3)
SODIUM: 142 meq/L (ref 137–147)

## 2014-07-08 LAB — CBC WITH DIFFERENTIAL/PLATELET
Basophils Absolute: 0 10*3/uL (ref 0.0–0.1)
Basophils Relative: 0 % (ref 0–1)
EOS ABS: 0.2 10*3/uL (ref 0.0–0.7)
EOS PCT: 2 % (ref 0–5)
HEMATOCRIT: 36.6 % (ref 36.0–46.0)
Hemoglobin: 12.1 g/dL (ref 12.0–15.0)
LYMPHS ABS: 2.1 10*3/uL (ref 0.7–4.0)
Lymphocytes Relative: 23 % (ref 12–46)
MCH: 29.7 pg (ref 26.0–34.0)
MCHC: 33.1 g/dL (ref 30.0–36.0)
MCV: 89.7 fL (ref 78.0–100.0)
Monocytes Absolute: 0.7 10*3/uL (ref 0.1–1.0)
Monocytes Relative: 8 % (ref 3–12)
Neutro Abs: 6.3 10*3/uL (ref 1.7–7.7)
Neutrophils Relative %: 67 % (ref 43–77)
PLATELETS: 302 10*3/uL (ref 150–400)
RBC: 4.08 MIL/uL (ref 3.87–5.11)
RDW: 14.2 % (ref 11.5–15.5)
WBC: 9.3 10*3/uL (ref 4.0–10.5)

## 2014-07-08 MED ORDER — IBUPROFEN 400 MG PO TABS
600.0000 mg | ORAL_TABLET | Freq: Once | ORAL | Status: AC
  Administered 2014-07-08: 600 mg via ORAL
  Filled 2014-07-08 (×2): qty 1

## 2014-07-08 MED ORDER — PSEUDOEPHEDRINE HCL 30 MG PO TABS
60.0000 mg | ORAL_TABLET | Freq: Once | ORAL | Status: AC
  Administered 2014-07-08: 60 mg via ORAL
  Filled 2014-07-08: qty 2

## 2014-07-08 NOTE — ED Notes (Signed)
Patient transported to X-ray 

## 2014-07-08 NOTE — ED Provider Notes (Signed)
CSN: [id number]     Arrival date & time 07/08/14  1840 History  This chart was scribed for Artis Delay, MD by Tula Nakayama, ED Scribe. This patient was seen in room MH07/MH07 and the patient's care was started at 6:55 PM.    Chief Complaint  Patient presents with  . URI   Patient is a 33 y.o. female presenting with URI. The history is provided by the patient. No language interpreter was used.  URI Presenting symptoms: congestion, cough, ear pain, fever and sore throat   Congestion:    Location:  Nasal Cough:    Cough characteristics: wet.   Sputum characteristics:  Unable to specify   Severity:  Moderate   Onset quality:  Gradual   Duration:  3 days   Timing:  Intermittent   Progression:  Unchanged   Chronicity:  New Ear pain:    Location:  Right   Severity:  Mild   Onset quality:  Gradual   Duration:  1 day   Progression:  Unable to specify   Chronicity:  New Fever:    Duration:  2 days   Timing:  Intermittent   Max temp PTA (F):  102   Temp source:  Unable to specify   Progression:  Improving Severity:  Mild Onset quality:  Gradual Progression:  Unchanged Chronicity:  New Relieved by:  OTC medications Worsened by:  Nothing tried Ineffective treatments:  OTC medications Associated symptoms: arthralgias and headaches    HPI Comments: Chelsea Davidson is a 33 y.o. female who presents to the Emergency Department complaining of gradually worsening, wet cough that started 3 days ago. She states fever of 102 that started yesterday and headache concurrent with the cough as associated symptoms. She also complains of mild sore throat, mild nasal congestion and dyspnea, rib pain and chest soreness associated with cough. Pt states she has SOB after coughing spells and leg swelling that started a few days ago. Pt has tried Tylenol and Motrin with some relief to symptoms. Pt states she quit smoking 5 days ago. Pt states her LNMP 9/22 and that it is normal for her to have irregular  menstrual periods. Pt denies abdominal pain, nausea, and vomiting as associated symptoms.  Past Medical History  Diagnosis Date  . Polycystic ovaries   . Fibroid   . Gall bladder disease   . Obesity    Past Surgical History  Procedure Laterality Date  . Cesarean section    . Appendectomy    . Carpal tunnel release    . Ulnar nerve repair    . Radial nerve    . Ganglion cyst excision     History reviewed. No pertinent family history. History  Substance Use Topics  . Smoking status: Current Every Day Smoker -- 1.00 packs/day    Types: Cigarettes  . Smokeless tobacco: Not on file  . Alcohol Use: No   OB History   Grav Para Term Preterm Abortions TAB SAB Ect Mult Living                 Review of Systems  Constitutional: Positive for fever.  HENT: Positive for congestion, ear pain and sore throat.   Respiratory: Positive for cough.   Musculoskeletal: Positive for arthralgias.  Neurological: Positive for headaches.  All other systems reviewed and are negative.     Allergies  Latex and Toradol  Home Medications   Prior to Admission medications   Medication Sig Start Date End Date Taking? Authorizing Provider  azithromycin (ZITHROMAX) 250 MG tablet Take 1 tablet (250 mg total) by mouth daily. Take first 2 tablets together, then 1 every day until finished. 08/13/13   Fransico Meadow, PA-C  clindamycin (CLEOCIN) 150 MG capsule Take 1 capsule (150 mg total) by mouth every 6 (six) hours. 09/27/13   Hope Bunnie Pion, NP  HYDROcodone-acetaminophen (HYCET) 7.5-325 mg/15 ml solution Take 15 mLs by mouth 4 (four) times daily as needed for moderate pain. 09/22/13 09/22/14  Idalia Needle. [place], PA-C  HYDROcodone-acetaminophen (NORCO) 5-325 MG per tablet Take 2 tablets by mouth every 6 (six) hours as needed. 03/07/14   April Alfonso Patten, MD  HYDROcodone-acetaminophen (NORCO/VICODIN) 5-325 MG per tablet Take 2 tablets by mouth every 6 (six) hours as needed for moderate pain. 10/15/13   Charles  B. Karle Starch, MD  HYDROcodone-acetaminophen (NORCO/VICODIN) 5-325 MG per tablet Take 1 tablet by mouth every 4 (four) hours as needed for moderate pain. 01/20/14   Evalee Jefferson, PA-C  HYDROcodone-homatropine (HYDROMET) 5-1.5 MG/5ML syrup Take 5 mLs by mouth every 6 (six) hours as needed for cough. 08/13/13   Fransico Meadow, PA-C  lidocaine (XYLOCAINE) 2 % solution Use as directed 20 mLs in the mouth or throat as needed for mouth pain. 09/27/13   [place], NP  oxyCODONE-acetaminophen (PERCOCET/ROXICET) 5-325 MG per tablet Take 1 tablet by mouth every 6 (six) hours as needed for severe pain. 02/02/14   Ernestina Patches, MD  phenazopyridine (PYRIDIUM) 200 MG tablet Take 1 tablet (200 mg total) by mouth 3 (three) times daily. 03/07/14   April K Palumbo-Rasch, MD  sulfamethoxazole-trimethoprim (SEPTRA DS) 800-160 MG per tablet Take 1 tablet by mouth 2 (two) times daily. 02/02/14   Ernestina Patches, MD   BP 142/85  Pulse 93  Temp(Src) 98.1 F (36.7 C) (Oral)  Resp 18  Ht 5\' 3"  (1.6 m)  Wt 228 lb (103.42 kg)  BMI 40.40 kg/m2  SpO2 99%  LMP 06/01/2014 Physical Exam  Nursing note and vitals reviewed. Constitutional: She is oriented to person, place, and time. She appears well-developed and well-nourished. No distress.  HENT:  Head: Normocephalic and atraumatic.  Right Ear: Tympanic membrane and ear canal normal.  Left Ear: Tympanic membrane and ear canal normal.  Nose: Right sinus exhibits frontal sinus tenderness. Right sinus exhibits no maxillary sinus tenderness. Left sinus exhibits frontal sinus tenderness. Left sinus exhibits no maxillary sinus tenderness.  Mouth/Throat: Uvula is midline, oropharynx is clear and moist and mucous membranes are normal. No trismus in the jaw. No uvula swelling. No posterior oropharyngeal edema, posterior oropharyngeal erythema or tonsillar abscesses.  Eyes: Conjunctivae are normal. Pupils are equal, round, and reactive to light. No scleral icterus.  Neck: Neck supple.   Cardiovascular: Normal rate, regular rhythm, normal heart sounds and intact distal pulses.   No murmur heard. Pulmonary/Chest: Effort normal and breath sounds normal. No stridor. No respiratory distress. She has no rales.  Abdominal: Soft. Bowel sounds are normal. She exhibits no distension. There is no tenderness.  Musculoskeletal: Normal range of motion. She exhibits no edema.  Neurological: She is alert and oriented to person, place, and time.  Skin: Skin is warm and dry. No rash noted.  Psychiatric: She has a normal mood and affect. Her behavior is normal.    ED Course  Procedures (including critical care time)  DIAGNOSTIC STUDIES: Oxygen Saturation is 99% on RA, normal by my interpretation.    COORDINATION OF CARE: 7:10 PM Discussed treatment plan with pt at bedside and pt  agreed to plan.   Labs Review Labs Reviewed  CBC WITH DIFFERENTIAL  BASIC METABOLIC PANEL    Imaging Review Dg Chest 2 View  07/08/2014   CLINICAL DATA:  Cough.  Fever.  EXAM: CHEST  2 VIEW  COMPARISON:  09/15/2013  FINDINGS: Mild bronchitic changes which were also seen previously. No pneumonia, effusion, edema, or pneumothorax. Normal heart size and mediastinal contours.  IMPRESSION: 1. Negative for pneumonia. 2. Mild bronchitic markings.   Electronically Signed   By: Jorje Guild M.D.   On: 07/08/2014 19:28  All radiology studies independently viewed by me.      EKG Interpretation None      MDM   Final diagnoses:  Cough  Upper respiratory infection    33 year old female with URI symptoms for the past several days. We will obtain chest x-ray due to high fever, cough, subjective shortness of breath. If negative, suspect viral URI. We have discussed supportive treatment and return precautions.  I personally performed the services described in this documentation, which was scribed in my presence. The recorded information has been reviewed and is accurate.      Artis Delay, MD 07/08/14  2056

## 2014-07-08 NOTE — ED Notes (Signed)
Pt c/o URI symptoms x 3 days 

## 2014-07-08 NOTE — Discharge Instructions (Signed)
Upper Respiratory Infection, Adult An upper respiratory infection (URI) is also sometimes known as the common cold. The upper respiratory tract includes the nose, sinuses, throat, trachea, and bronchi. Bronchi are the airways leading to the lungs. Most people improve within 1 week, but symptoms can last up to 2 weeks. A residual cough may last even longer.  CAUSES Many different viruses can infect the tissues lining the upper respiratory tract. The tissues become irritated and inflamed and often become very moist. Mucus production is also common. A cold is contagious. You can easily spread the virus to others by oral contact. This includes kissing, sharing a glass, coughing, or sneezing. Touching your mouth or nose and then touching a surface, which is then touched by another person, can also spread the virus. SYMPTOMS  Symptoms typically develop 1 to 3 days after you come in contact with a cold virus. Symptoms vary from person to person. They may include:  Runny nose.  Sneezing.  Nasal congestion.  Sinus irritation.  Sore throat.  Loss of voice (laryngitis).  Cough.  Fatigue.  Muscle aches.  Loss of appetite.  Headache.  Low-grade fever. DIAGNOSIS  You might diagnose your own cold based on familiar symptoms, since most people get a cold 2 to 3 times a year. Your caregiver can confirm this based on your exam. Most importantly, your caregiver can check that your symptoms are not due to another disease such as strep throat, sinusitis, pneumonia, asthma, or epiglottitis. Blood tests, throat tests, and X-rays are not necessary to diagnose a common cold, but they may sometimes be helpful in excluding other more serious diseases. Your caregiver will decide if any further tests are required. RISKS AND COMPLICATIONS  You may be at risk for a more severe case of the common cold if you smoke cigarettes, have chronic heart disease (such as heart failure) or lung disease (such as asthma), or if  you have a weakened immune system. The very young and very old are also at risk for more serious infections. Bacterial sinusitis, middle ear infections, and bacterial pneumonia can complicate the common cold. The common cold can worsen asthma and chronic obstructive pulmonary disease (COPD). Sometimes, these complications can require emergency medical care and may be life-threatening. PREVENTION  The best way to protect against getting a cold is to practice good hygiene. Avoid oral or hand contact with people with cold symptoms. Wash your hands often if contact occurs. There is no clear evidence that vitamin C, vitamin E, echinacea, or exercise reduces the chance of developing a cold. However, it is always recommended to get plenty of rest and practice good nutrition. TREATMENT  Treatment is directed at relieving symptoms. There is no cure. Antibiotics are not effective, because the infection is caused by a virus, not by bacteria. Treatment may include:  Increased fluid intake. Sports drinks offer valuable electrolytes, sugars, and fluids.  Breathing heated mist or steam (vaporizer or shower).  Eating chicken soup or other clear broths, and maintaining good nutrition.  Getting plenty of rest.  Using gargles or lozenges for comfort.  Controlling fevers with ibuprofen or acetaminophen as directed by your caregiver.  Increasing usage of your inhaler if you have asthma. Zinc gel and zinc lozenges, taken in the first 24 hours of the common cold, can shorten the duration and lessen the severity of symptoms. Pain medicines may help with fever, muscle aches, and throat pain. A variety of non-prescription medicines are available to treat congestion and runny nose. Your caregiver   can make recommendations and may suggest nasal or lung inhalers for other symptoms.  HOME CARE INSTRUCTIONS   Only take over-the-counter or prescription medicines for pain, discomfort, or fever as directed by your  caregiver.  Use a warm mist humidifier or inhale steam from a shower to increase air moisture. This may keep secretions moist and make it easier to breathe.  Drink enough water and fluids to keep your urine clear or pale yellow.  Rest as needed.  Return to work when your temperature has returned to normal or as your caregiver advises. You may need to stay home longer to avoid infecting others. You can also use a face mask and careful hand washing to prevent spread of the virus. SEEK MEDICAL CARE IF:   After the first few days, you feel you are getting worse rather than better.  You need your caregiver's advice about medicines to control symptoms.  You develop chills, worsening shortness of breath, or brown or red sputum. These may be signs of pneumonia.  You develop yellow or brown nasal discharge or pain in the face, especially when you bend forward. These may be signs of sinusitis.  You develop a fever, swollen neck glands, pain with swallowing, or white areas in the back of your throat. These may be signs of strep throat. SEEK IMMEDIATE MEDICAL CARE IF:   You have a fever.  You develop severe or persistent headache, ear pain, sinus pain, or chest pain.  You develop wheezing, a prolonged cough, cough up blood, or have a change in your usual mucus (if you have chronic lung disease).  You develop sore muscles or a stiff neck. Document Released: 02/21/2001 Document Revised: 11/20/2011 Document Reviewed: 12/03/2013 Exit[employer] Patient Information 2015 [employer]. This information is not intended to replace advice given to you by your health care provider. Make sure you discuss any questions you have with your health care provider.  

## 2014-07-08 NOTE — ED Notes (Signed)
MD at bedside. 

## 2014-10-14 ENCOUNTER — Emergency Department (HOSPITAL_BASED_OUTPATIENT_CLINIC_OR_DEPARTMENT_OTHER): Payer: Self-pay

## 2014-10-14 ENCOUNTER — Emergency Department (HOSPITAL_BASED_OUTPATIENT_CLINIC_OR_DEPARTMENT_OTHER)
Admission: EM | Admit: 2014-10-14 | Discharge: 2014-10-14 | Disposition: A | Discharge status: Home / Self Care | Payer: Self-pay | Attending: Emergency Medicine | Admitting: Emergency Medicine

## 2014-10-14 ENCOUNTER — Encounter (HOSPITAL_BASED_OUTPATIENT_CLINIC_OR_DEPARTMENT_OTHER): Payer: Self-pay | Admitting: *Deleted

## 2014-10-14 DIAGNOSIS — Z792 Long term (current) use of antibiotics: Secondary | ICD-10-CM | POA: Insufficient documentation

## 2014-10-14 DIAGNOSIS — Z91040 Latex allergy status: Secondary | ICD-10-CM | POA: Insufficient documentation

## 2014-10-14 DIAGNOSIS — Z9089 Acquired absence of other organs: Secondary | ICD-10-CM | POA: Insufficient documentation

## 2014-10-14 DIAGNOSIS — R1011 Right upper quadrant pain: Secondary | ICD-10-CM | POA: Insufficient documentation

## 2014-10-14 DIAGNOSIS — Z720 Tobacco use: Secondary | ICD-10-CM | POA: Insufficient documentation

## 2014-10-14 DIAGNOSIS — Z8742 Personal history of other diseases of the female genital tract: Secondary | ICD-10-CM | POA: Insufficient documentation

## 2014-10-14 DIAGNOSIS — Z79899 Other long term (current) drug therapy: Secondary | ICD-10-CM | POA: Insufficient documentation

## 2014-10-14 DIAGNOSIS — Z8719 Personal history of other diseases of the digestive system: Secondary | ICD-10-CM | POA: Insufficient documentation

## 2014-10-14 DIAGNOSIS — E669 Obesity, unspecified: Secondary | ICD-10-CM | POA: Insufficient documentation

## 2014-10-14 DIAGNOSIS — Z3202 Encounter for pregnancy test, result negative: Secondary | ICD-10-CM | POA: Insufficient documentation

## 2014-10-14 LAB — URINALYSIS, ROUTINE W REFLEX MICROSCOPIC
BILIRUBIN URINE: NEGATIVE
Ketones, ur: NEGATIVE mg/dL
LEUKOCYTES UA: NEGATIVE
Nitrite: NEGATIVE
PROTEIN: NEGATIVE mg/dL
SPECIFIC GRAVITY, URINE: 1.03 (ref 1.005–1.030)
Urobilinogen, UA: 0.2 mg/dL (ref 0.0–1.0)
pH: 5.5 (ref 5.0–8.0)

## 2014-10-14 LAB — URINE MICROSCOPIC-ADD ON

## 2014-10-14 LAB — CBC WITH DIFFERENTIAL/PLATELET
Basophils Absolute: 0.1 10*3/uL (ref 0.0–0.1)
Basophils Relative: 1 % (ref 0–1)
EOS PCT: 1 % (ref 0–5)
Eosinophils Absolute: 0.2 10*3/uL (ref 0.0–0.7)
HEMATOCRIT: 39.5 % (ref 36.0–46.0)
HEMOGLOBIN: 13.4 g/dL (ref 12.0–15.0)
LYMPHS PCT: 23 % (ref 12–46)
Lymphs Abs: 2.8 10*3/uL (ref 0.7–4.0)
MCH: 29.6 pg (ref 26.0–34.0)
MCHC: 33.9 g/dL (ref 30.0–36.0)
MCV: 87.2 fL (ref 78.0–100.0)
MONO ABS: 0.7 10*3/uL (ref 0.1–1.0)
MONOS PCT: 6 % (ref 3–12)
Neutro Abs: 8.6 10*3/uL — ABNORMAL HIGH (ref 1.7–7.7)
Neutrophils Relative %: 69 % (ref 43–77)
Platelets: 373 10*3/uL (ref 150–400)
RBC: 4.53 MIL/uL (ref 3.87–5.11)
RDW: 14.9 % (ref 11.5–15.5)
WBC: 12.4 10*3/uL — AB (ref 4.0–10.5)

## 2014-10-14 LAB — COMPREHENSIVE METABOLIC PANEL
ALT: 20 U/L (ref 0–35)
ANION GAP: 5 (ref 5–15)
AST: 20 U/L (ref 0–37)
Albumin: 4.5 g/dL (ref 3.5–5.2)
Alkaline Phosphatase: 51 U/L (ref 39–117)
BUN: 13 mg/dL (ref 6–23)
CO2: 22 mmol/L (ref 19–32)
Calcium: 9.1 mg/dL (ref 8.4–10.5)
Chloride: 110 mmol/L (ref 96–112)
Creatinine, Ser: 0.71 mg/dL (ref 0.50–1.10)
GFR calc Af Amer: >90 mL/min (ref >90)
Glucose, Bld: 190 mg/dL — ABNORMAL HIGH (ref 70–99)
POTASSIUM: 3.6 mmol/L (ref 3.5–5.1)
Sodium: 137 mmol/L (ref 135–145)
Total Bilirubin: 0.1 mg/dL — ABNORMAL LOW (ref 0.3–1.2)
Total Protein: 7.9 g/dL (ref 6.0–8.3)

## 2014-10-14 LAB — LIPASE, BLOOD: LIPASE: 44 U/L (ref 11–59)

## 2014-10-14 LAB — PREGNANCY, URINE: Preg Test, Ur: NEGATIVE

## 2014-10-14 MED ORDER — HYDROMORPHONE HCL 1 MG/ML IJ SOLN
1.0000 mg | Freq: Once | INTRAMUSCULAR | Status: AC
  Administered 2014-10-14: 1 mg via INTRAVENOUS
  Filled 2014-10-14: qty 1

## 2014-10-14 MED ORDER — ONDANSETRON HCL 4 MG/2ML IJ SOLN
4.0000 mg | Freq: Once | INTRAMUSCULAR | Status: AC
  Administered 2014-10-14: 4 mg via INTRAVENOUS

## 2014-10-14 MED ORDER — ONDANSETRON 4 MG PO TBDP
4.0000 mg | ORAL_TABLET | Freq: Three times a day (TID) | ORAL | Status: AC | PRN
Start: 2014-10-14 — End: ?

## 2014-10-14 MED ORDER — ONDANSETRON HCL 4 MG/2ML IJ SOLN
INTRAMUSCULAR | Status: AC
  Administered 2014-10-14: 4 mg via INTRAVENOUS
  Filled 2014-10-14: qty 2

## 2014-10-14 MED ORDER — HYDROCODONE-ACETAMINOPHEN 5-325 MG PO TABS: 1.0000 | ORAL_TABLET | ORAL | Status: AC | PRN

## 2014-10-14 NOTE — ED Provider Notes (Signed)
CSN: [id number]     Arrival date & time 10/14/14  1714 History   First MD Initiated Contact with Patient 10/14/14 1721     Chief Complaint  Patient presents with  . Abdominal Pain     (Consider location/radiation/quality/duration/timing/severity/associated sxs/prior Treatment) HPI Comments: Pt c/o right upper abdominal pain that started this morning. No fever. Has vomiting and diarrhea. Has similar history and was told that she has gallbladder disease. States that she has had low grade fever. Hasn't taken anything for the symptoms  The history is provided by the patient. No language interpreter was used.    Past Medical History  Diagnosis Date  . Polycystic ovaries   . Fibroid   . Gall bladder disease   . Obesity    Past Surgical History  Procedure Laterality Date  . Cesarean section    . Appendectomy    . Carpal tunnel release    . Ulnar nerve repair    . Radial nerve    . Ganglion cyst excision     History reviewed. No pertinent family history. History  Substance Use Topics  . Smoking status: Current Every Day Smoker -- 1.00 packs/day    Types: Cigarettes  . Smokeless tobacco: Not on file  . Alcohol Use: No   OB History    No data available     Review of Systems  All other systems reviewed and are negative.     Allergies  Latex and Toradol  Home Medications   Prior to Admission medications   Medication Sig Start Date End Date Taking? Authorizing Provider  azithromycin (ZITHROMAX) 250 MG tablet Take 1 tablet (250 mg total) by mouth daily. Take first 2 tablets together, then 1 every day until finished. 08/13/13   Fransico Meadow, PA-C  clindamycin (CLEOCIN) 150 MG capsule Take 1 capsule (150 mg total) by mouth every 6 (six) hours. 09/27/13   Hope Bunnie Pion, NP  HYDROcodone-acetaminophen (NORCO) 5-325 MG per tablet Take 2 tablets by mouth every 6 (six) hours as needed. 03/07/14   April Alfonso Patten, MD  HYDROcodone-acetaminophen (NORCO/VICODIN) 5-325 MG per  tablet Take 2 tablets by mouth every 6 (six) hours as needed for moderate pain. 10/15/13   Charles B. Karle Starch, MD  HYDROcodone-acetaminophen (NORCO/VICODIN) 5-325 MG per tablet Take 1 tablet by mouth every 4 (four) hours as needed for moderate pain. 01/20/14   Evalee Jefferson, PA-C  HYDROcodone-homatropine (HYDROMET) 5-1.5 MG/5ML syrup Take 5 mLs by mouth every 6 (six) hours as needed for cough. 08/13/13   Fransico Meadow, PA-C  lidocaine (XYLOCAINE) 2 % solution Use as directed 20 mLs in the mouth or throat as needed for mouth pain. 09/27/13   [place], NP  oxyCODONE-acetaminophen (PERCOCET/ROXICET) 5-325 MG per tablet Take 1 tablet by mouth every 6 (six) hours as needed for severe pain. 02/02/14   Ernestina Patches, MD  phenazopyridine (PYRIDIUM) 200 MG tablet Take 1 tablet (200 mg total) by mouth 3 (three) times daily. 03/07/14   April K Palumbo-Rasch, MD  sulfamethoxazole-trimethoprim (SEPTRA DS) 800-160 MG per tablet Take 1 tablet by mouth 2 (two) times daily. 02/02/14   Ernestina Patches, MD   BP 133/80 mmHg  Pulse 129  Temp(Src) 99.1 F (37.3 C) (Oral)  Resp 30  SpO2 98% Physical Exam  Constitutional: She is oriented to person, place, and time. She appears well-developed and well-nourished.  Cardiovascular: Normal rate and regular rhythm.   Pulmonary/Chest: Effort normal and breath sounds normal.  Abdominal: There is tenderness in  the right upper quadrant.  Neurological: She is alert and oriented to person, place, and time.  Skin: Skin is warm and dry.  Nursing note and vitals reviewed.   ED Course  Procedures (including critical care time) Labs Review Labs Reviewed  COMPREHENSIVE METABOLIC PANEL - Abnormal; Notable for the following:    Glucose, Bld 190 (*)    Total Bilirubin 0.1 (*)    All other components within normal limits  CBC WITH DIFFERENTIAL/PLATELET - Abnormal; Notable for the following:    WBC 12.4 (*)    Neutro Abs 8.6 (*)    All other components within normal limits   URINALYSIS, ROUTINE W REFLEX MICROSCOPIC - Abnormal; Notable for the following:    Glucose, UA >1000 (*)    Hgb urine dipstick MODERATE (*)    All other components within normal limits  URINE MICROSCOPIC-ADD ON - Abnormal; Notable for the following:    Squamous Epithelial / LPF FEW (*)    Bacteria, UA FEW (*)    All other components within normal limits  LIPASE, BLOOD  PREGNANCY, URINE    Imaging Review US Abdomen Complete  10/14/2014   CLINICAL DATA:  Severe right upper quadrant pain beginning this morning.  EXAM: ULTRASOUND ABDOMEN COMPLETE  COMPARISON:  None.  FINDINGS: Gallbladder: The gallbladder is focally tender but there is no visible stone or wall thickening. The gallbladder is not overdistended to suggest obstruction.  Common bile duct: Diameter: 4 mm  Liver: The liver is difficult to insonate, and echogenic, compatible with steatosis. This decreases the sensitivity of sonography.  IVC: No abnormality visualized.  Pancreas: Visualized portion unremarkable.  Spleen: Size and appearance within normal limits.  Right Kidney: Length: 11 cm. Echogenicity within normal limits. No mass or hydronephrosis visualized. There is a hypoechoic 4.7 cm suprarenal mass correlating with known adrenal adenoma.  Left Kidney: Length: 11 cm. Echogenicity within normal limits. No mass or hydronephrosis visualized.  Abdominal aorta: Partially visualized. No aortic aneurysm on abdominal CT from late last year.  Other findings: None.  IMPRESSION: 1. The gallbladder is focally tender but there is no stones or wall thickening to suggest acute cholecystitis or obstruction. 2. Hepatic steatosis. 3. Right adrenal adenoma.   Electronically Signed   By: Jorje Guild M.D.   On: 10/14/2014 19:07     EKG Interpretation None      MDM   Final diagnoses:  Right upper quadrant pain    Pt comfortable at this time. [place] not consistent with acute cholecystitis. Pt given follow up with ccs and hydrocodone for  pain    Glendell Docker, NP 10/14/14 4742  Threasa Beards, MD 10/14/14 2108

## 2014-10-14 NOTE — ED Notes (Signed)
Patient c/o R upper abd pain since 0500, no meds taken

## 2014-10-14 NOTE — Discharge Instructions (Signed)
Abdominal Pain Many things can cause abdominal pain. Usually, abdominal pain is not caused by a disease and will improve without treatment. It can often be observed and treated at home. Your health care provider will do a physical exam and possibly order blood tests and X-rays to help determine the seriousness of your pain. However, in many cases, more time must pass before a clear cause of the pain can be found. Before that point, your health care provider may not know if you need more testing or further treatment. HOME CARE INSTRUCTIONS  Monitor your abdominal pain for any changes. The following actions may help to alleviate any discomfort you are experiencing:  Only take over-the-counter or prescription medicines as directed by your health care provider.  Do not take laxatives unless directed to do so by your health care provider.  Try a clear liquid diet (broth, tea, or water) as directed by your health care provider. Slowly move to a bland diet as tolerated. SEEK MEDICAL CARE IF:  You have unexplained abdominal pain.  You have abdominal pain associated with nausea or diarrhea.  You have pain when you urinate or have a bowel movement.  You experience abdominal pain that wakes you in the night.  You have abdominal pain that is worsened or improved by eating food.  You have abdominal pain that is worsened with eating fatty foods.  You have a fever. SEEK IMMEDIATE MEDICAL CARE IF:   Your pain does not go away within 2 hours.  You keep throwing up (vomiting).  Your pain is felt only in portions of the abdomen, such as the right side or the left lower portion of the abdomen.  You pass bloody or black tarry stools. MAKE SURE YOU:  Understand these instructions.   Will watch your condition.   Will get help right away if you are not doing well or get worse.  Document Released: 06/07/2005 Document Revised: 09/02/2013 Document Reviewed: 05/07/2013 [facility] Patient Information  2015 [place], [place]. This information is not intended to replace advice given to you by your health care provider. Make sure you discuss any questions you have with your health care provider.  Biliary Colic  Biliary colic is a steady or irregular pain in the upper abdomen. It is usually under the right side of the rib cage. It happens when gallstones interfere with the normal flow of bile from the gallbladder. Bile is a liquid that helps to digest fats. Bile is made in the liver and stored in the gallbladder. When you eat a meal, bile passes from the gallbladder through the cystic duct and the common bile duct into the small intestine. There, it mixes with partially digested food. If a gallstone blocks either of these ducts, the normal flow of bile is blocked. The muscle cells in the bile duct contract forcefully to try to move the stone. This causes the pain of biliary colic.  SYMPTOMS   A person with biliary colic usually complains of pain in the upper abdomen. This pain can be:  In the center of the upper abdomen just below the breastbone.  In the upper-right part of the abdomen, near the gallbladder and liver.  Spread back toward the right shoulder blade.  Nausea and vomiting.  The pain usually occurs after eating.  Biliary colic is usually triggered by the digestive system's demand for bile. The demand for bile is high after fatty meals. Symptoms can also occur when a person who has been fasting suddenly eats a very  large meal. Most episodes of biliary colic pass after 1 to 5 hours. After the most intense pain passes, your abdomen may continue to ache mildly for about 24 hours. DIAGNOSIS  After you describe your symptoms, your caregiver will perform a physical exam. He or she will pay attention to the upper right portion of your belly (abdomen). This is the area of your liver and gallbladder. An ultrasound will help your caregiver look for gallstones. Specialized scans of the gallbladder may  also be done. Blood tests may be done, especially if you have fever or if your pain persists. PREVENTION  Biliary colic can be prevented by controlling the risk factors for gallstones. Some of these risk factors, such as heredity, increasing age, and pregnancy are a normal part of life. Obesity and a high-fat diet are risk factors you can change through a healthy lifestyle. Women going through menopause who take hormone replacement therapy (estrogen) are also more likely to develop biliary colic. TREATMENT   Pain medication may be prescribed.  You may be encouraged to eat a fat-free diet.  If the first episode of biliary colic is severe, or episodes of colic keep retuning, surgery to remove the gallbladder (cholecystectomy) is usually recommended. This procedure can be done through small incisions using an instrument called a laparoscope. The procedure often requires a brief stay in the hospital. Some people can leave the hospital the same day. It is the most widely used treatment in people troubled by painful gallstones. It is effective and safe, with no complications in more than 90% of cases.  If surgery cannot be done, medication that dissolves gallstones may be used. This medication is expensive and can take months or years to work. Only small stones will dissolve.  Rarely, medication to dissolve gallstones is combined with a procedure called shock-wave lithotripsy. This procedure uses carefully aimed shock waves to break up gallstones. In many people treated with this procedure, gallstones form again within a few years. PROGNOSIS  If gallstones block your cystic duct or common bile duct, you are at risk for repeated episodes of biliary colic. There is also a 25% chance that you will develop a gallbladder infection(acute cholecystitis), or some other complication of gallstones within 10 to 20 years. If you have surgery, schedule it at a time that is convenient for you and at a time when you are  not sick. HOME CARE INSTRUCTIONS   Drink plenty of clear fluids.  Avoid fatty, greasy or fried foods, or any foods that make your pain worse.  Take medications as directed. SEEK MEDICAL CARE IF:   You develop a fever over 100.5 F (38.1 C).  Your pain gets worse over time.  You develop nausea that prevents you from eating and drinking.  You develop vomiting. SEEK IMMEDIATE MEDICAL CARE IF:   You have continuous or severe belly (abdominal) pain which is not relieved with medications.  You develop nausea and vomiting which is not relieved with medications.  You have symptoms of biliary colic and you suddenly develop a fever and shaking chills. This may signal cholecystitis. Call your caregiver immediately.  You develop a yellow color to your skin or the white part of your eyes (jaundice). Document Released: 01/29/2006 Document Revised: 11/20/2011 Document Reviewed: 04/09/2008 [facility] Patient Information 2015 [place], [place]. This information is not intended to replace advice given to you by your health care provider. Make sure you discuss any questions you have with your health care provider.  Emergency Department Resource Guide  1) Find a Tax adviser and Pay Out of Pocket Although you won't have to find out who is covered by your insurance plan, it is a good idea to ask around and get recommendations. You will then need to call the office and see if the doctor you have chosen will accept you as a new patient and what types of options they offer for patients who are self-pay. Some doctors offer discounts or will set up payment plans for their patients who do not have insurance, but you will need to ask so you aren't surprised when you get to your appointment.  2) Contact Your Local Health Department Not all health departments have doctors that can see patients for sick visits, but many do, so it is worth a call to see if yours does. If you don't know where your local health department is,  you can check in your phone book. The CDC also has a tool to help you locate your state's health department, and many state websites also have listings of all of their local health departments.  3) Find a [facility] If your illness is not likely to be very severe or complicated, you may want to try a walk in clinic. These are popping up all over the country in pharmacies, drugstores, and shopping centers. They're usually staffed by nurse practitioners or physician assistants that have been trained to treat common illnesses and complaints. They're usually fairly quick and inexpensive. However, if you have serious medical issues or chronic medical problems, these are probably not your best option.  No Primary Care Doctor: - Call Health Connect at  [phone] - they can help you locate a primary care doctor that  accepts your insurance, provides certain services, etc. - Physician Referral Service- [phone]  Chronic Pain Problems: Organization         Address  Phone   Notes  [facility]  [phone] Patients need to be referred by their primary care doctor.   Medication Assistance: Organization         Address  Phone   Notes  [facility] Medication [facility] [address] [place], [place] [address] [phone] --Must be a resident of [facility] -- Must have NO insurance coverage whatsoever (no Medicaid/ Medicare, etc.) -- The pt. MUST have a primary care doctor that directs their care regularly and follows them in the community   Med[facility]ist  [phone]   [employer]  [phone]    Agencies that provide inexpensive medical care: Organization         Address  Phone   Notes  [place]  [phone]   [facility]    [phone]   [facility] [place], [place] [address] [phone]   [place] [address] [place] [phone]   Planned Parenthood    [phone]   [facility]    [phone]   [place] and [place], [place] Phone:  [phone], Fax:  [phone] Hours of Operation:  9 am - 6 pm, M-F.  Also accepts Medicaid/Medicare and self-pay.  [facility] for [place] [address], [address], [place] Phone: [phone], Fax: [phone]. Hours of Operation:  8:30 am - 5:30 pm, M-F.  Also accepts Medicaid and self-pay.  [facility] [place] [address], [place] Phone: [phone]   [place], [place], [place] [phone], Ext. [phone] Mondays & Thursdays: 7-9 AM.  First 15 patients are seen on a first come, first serve basis.    [place] Providers:  Organization         Address  Phone   Notes  [facility] [address], [place] [phone] Also accepts self-pay patients.  [facility] [address], [place]  [phone]   [place], [address], [place] [phone]   [facility] [address] [place] [phone]   Lucianne Lei [address], [place]   [phone] Only accepts [place] Access [place] patients after they have their name applied to their card.   Self-Pay (no insurance) in [facility]:  Organization         Address  Phone   Notes  Sickle Cell Patients, [facility] [place] [phone]   [facility] [place] [phone]   [facility] [place]  [place], [place], [place] [phone]   [facility]/Dr. Osei-Bonsu  [address] [place] or [address], [place] [phone] Phone number for both [place] and [place] locations  is the same.  Urgent Medical and [facility] [address], [place] [phone]   [facility] [address], [place] or [address] [phone]   [facility] [address], [place] [phone], phone; [phone], fax Sees patients 1st and 3rd Saturday of every month.  Must not qualify for public or private insurance (i.e. Medicaid, Medicare, [place] Health Choice, Veterans' Benefits)  Household income should be no more than 200% of the poverty level The clinic cannot treat you if you are pregnant or think you are pregnant  Sexually transmitted diseases are not treated at the clinic.    Dental Care: Organization         Address  Phone  Notes  [facility] Department of [facility] [place] [phone] Accepts children up to age 64 who are enrolled in [place] or [place]; pregnant women with a Medicaid card; and children who have applied for Medicaid or [place] Health Choice, but were declined, whose parents can pay a reduced fee at time of service.  [facility] Department of [facility]  [address], [place] [phone] Accepts children up to age 42 who are enrolled in [place] or [place]; pregnant women with a Medicaid card; and children who have applied for Medicaid or [place] Health Choice, but were declined, whose parents can pay a reduced fee at time of service.  [place] Adult [facility] Access PROGRAM  [place] [phone] Patients are seen by appointment only. Walk-ins are not accepted. [place] will see patients 14 years of age and older. Monday - Tuesday (8am-5pm) Most Wednesdays (8:30-5pm) $30 per visit, cash only  [facility] Adult Dental Access PROGRAM  [address], [facility] [phone] Patients are seen by appointment only. Walk-ins are not accepted. [place] will see  patients 70 years of age and older. One Wednesday Evening (Monthly:  Volunteer Based).  $30 per visit, cash only  [place]  [phone] for adults; Children under age 46, call Graduate Pediatric Dentistry at [phone]. Children aged 20-14, please call [phone] to request a pediatric application.  Dental services are provided in all areas of dental care including fillings, crowns and bridges, complete and partial dentures, implants, gum treatment, root canals, and extractions. Preventive care is also provided. Treatment is provided to both adults and children. Patients are selected via a lottery and there is often a waiting list.   [facility] [address], [place]  [phone] [email or web address]   [facility] [address] [place], [place] [phone], Ext. [phone] Second and Fourth Thursday of each month, opens at 6:30 AM; Clinic ends at 9 AM.  Patients are seen on a first-come first-served basis, and a limited number are seen during each clinic.   [facility]  [address] Hillard Danker [place], [place] [phone]   Eligibility Requirements You must have lived in [place], [place], or [place] counties for at least the last three months.   You cannot be eligible for state or federal sponsored [employer], including [employer], [place], or [employer].   You generally cannot be eligible for healthcare insurance through your employer.    How to apply: Eligibility screenings are held every Tuesday and Wednesday afternoon from 1:00 pm until 4:00 pm. You do not need an appointment for the interview!  [facility] [address] [place], [place]   [place]  [place] Department  [place]  [phone]    Behavioral Health Resources in the Community: Intensive Outpatient  Programs Organization         Address  Phone  Notes  [place]. [address] [place], [place] [phone]   [facility] Outpatient [address] [place], [place]   ADS: Alcohol & Drug Svcs [address], [place], [place]   [place] [address]  [place], [place] or [phone]   Substance Abuse Resources Organization         Address  Phone  Notes  Al[facility] Drug Services  [phone]   [place]  [phone]   [facility] [place]   Chinita Pester  [phone]   Residential & Outpatient Substance Abuse Program  [phone]   Psychological Services Organization         Address  Phone  Notes  [facility] [place]  [place]  [phone]   [place] [address] [place] or [phone]    Mobile Crisis Teams Organization         Address  Phone  Notes  [facility], Mobile Crisis Care Unit  [phone]   [facility]  [address] [place], [place]   [place] [address] [place] [phone]    Self-Help/Support Groups Organization         Address  Phone             Notes  [place]. of [place] - variety of support groups  [place] Call for more information  Narcotics Anonymous (NA), [facility]aring [facility] [address], [employer] [place]  2 meetings at this location   Residential Treatment  Programs Organization         Address  Phone  Notes  [facility] [address],    [place]  [phone]   [facility]  [address] [place] [address], [place], [place]   [place], [place] [phone] Admissions: 8am-3pm M-F  Incentives Substance [place] [address]    [place], [place]  [phone]   [facility] [address] [place], [place], [place]   [facility] [address]  [place], [place]   [facility]ight Programs - Intensive Outpatient [address] Kristeen [address], [place], [place]   [facility] ([place].) [place].,  [place], [place] [phone] or [phone]   [facility] Services (RTS) [address] [place], [place] Accepts Medicaid  [facility] [place] [address]  [place] [phone] Substance Abuse/Addiction Treatment   [facility] Organization         Address  Phone  Notes  [facility]  [phone]   Domenic Schwab, PhD [address] Arlis Porta [place], [place]   [phone] or [phone]   [place], [place] [phone]   [facility] [address], [place], [place] [phone] Insurance/Medicaid/sponsorship through [facility] and Families [address], [address] [place]                                    [place], [place] [phone] [place] [address], [place] [phone]    Dr. Adele Schilder  [phone]   [facility] Dept. 1) [address] [place] 2) [place] 3)  [place] [address], [place] [phone]  [phone]   [place] [phone] or [phone] (After Hours)

## 2015-02-28 ENCOUNTER — Encounter (HOSPITAL_BASED_OUTPATIENT_CLINIC_OR_DEPARTMENT_OTHER): Payer: Self-pay

## 2015-02-28 ENCOUNTER — Emergency Department (HOSPITAL_BASED_OUTPATIENT_CLINIC_OR_DEPARTMENT_OTHER): Payer: Self-pay

## 2015-02-28 ENCOUNTER — Emergency Department (HOSPITAL_BASED_OUTPATIENT_CLINIC_OR_DEPARTMENT_OTHER)
Admission: EM | Admit: 2015-02-28 | Discharge: 2015-02-28 | Disposition: A | Discharge status: Home / Self Care | Payer: Self-pay | Attending: Emergency Medicine | Admitting: Emergency Medicine

## 2015-02-28 DIAGNOSIS — Z8719 Personal history of other diseases of the digestive system: Secondary | ICD-10-CM | POA: Insufficient documentation

## 2015-02-28 DIAGNOSIS — Z86018 Personal history of other benign neoplasm: Secondary | ICD-10-CM | POA: Insufficient documentation

## 2015-02-28 DIAGNOSIS — Z91040 Latex allergy status: Secondary | ICD-10-CM | POA: Insufficient documentation

## 2015-02-28 DIAGNOSIS — E669 Obesity, unspecified: Secondary | ICD-10-CM | POA: Insufficient documentation

## 2015-02-28 DIAGNOSIS — R0789 Other chest pain: Secondary | ICD-10-CM

## 2015-02-28 DIAGNOSIS — Z792 Long term (current) use of antibiotics: Secondary | ICD-10-CM | POA: Insufficient documentation

## 2015-02-28 DIAGNOSIS — Z79899 Other long term (current) drug therapy: Secondary | ICD-10-CM | POA: Insufficient documentation

## 2015-02-28 DIAGNOSIS — Z720 Tobacco use: Secondary | ICD-10-CM | POA: Insufficient documentation

## 2015-02-28 LAB — CBC WITH DIFFERENTIAL/PLATELET
BASOS ABS: 0 10*3/uL (ref 0.0–0.1)
Basophils Relative: 0 % (ref 0–1)
Eosinophils Absolute: 0.3 10*3/uL (ref 0.0–0.7)
Eosinophils Relative: 3 % (ref 0–5)
HEMATOCRIT: 36.6 % (ref 36.0–46.0)
HEMOGLOBIN: 12.1 g/dL (ref 12.0–15.0)
LYMPHS ABS: 2.8 10*3/uL (ref 0.7–4.0)
Lymphocytes Relative: 29 % (ref 12–46)
MCH: 28.9 pg (ref 26.0–34.0)
MCHC: 33.1 g/dL (ref 30.0–36.0)
MCV: 87.6 fL (ref 78.0–100.0)
MONOS PCT: 7 % (ref 3–12)
Monocytes Absolute: 0.7 10*3/uL (ref 0.1–1.0)
NEUTROS ABS: 5.8 10*3/uL (ref 1.7–7.7)
NEUTROS PCT: 61 % (ref 43–77)
PLATELETS: 277 10*3/uL (ref 150–400)
RBC: 4.18 MIL/uL (ref 3.87–5.11)
RDW: 15 % (ref 11.5–15.5)
WBC: 9.5 10*3/uL (ref 4.0–10.5)

## 2015-02-28 LAB — COMPREHENSIVE METABOLIC PANEL
ALK PHOS: 47 U/L (ref 38–126)
ALT: 20 U/L (ref 14–54)
ANION GAP: 8 (ref 5–15)
AST: 14 U/L — ABNORMAL LOW (ref 15–41)
Albumin: 3.9 g/dL (ref 3.5–5.0)
BILIRUBIN TOTAL: 0.3 mg/dL (ref 0.3–1.2)
BUN: 13 mg/dL (ref 6–20)
CHLORIDE: 106 mmol/L (ref 101–111)
CO2: 25 mmol/L (ref 22–32)
Calcium: 8.8 mg/dL — ABNORMAL LOW (ref 8.9–10.3)
Creatinine, Ser: 0.61 mg/dL (ref 0.44–1.00)
GFR calc non Af Amer: >60 mL/min (ref >60)
GLUCOSE: 105 mg/dL — AB (ref 65–99)
POTASSIUM: 3.8 mmol/L (ref 3.5–5.1)
Sodium: 139 mmol/L (ref 135–145)
TOTAL PROTEIN: 7 g/dL (ref 6.5–8.1)

## 2015-02-28 LAB — LIPASE, BLOOD: Lipase: 28 U/L (ref 22–51)

## 2015-02-28 MED ORDER — TRAMADOL HCL 50 MG PO TABS: 50.0000 mg | ORAL_TABLET | Freq: Four times a day (QID) | ORAL | Status: AC | PRN

## 2015-02-28 NOTE — ED Notes (Signed)
Pt reports right axilla pain since last Thursday - reports feeling sensation of "swollen knot" to area. No obvious concerns noted to skin.

## 2015-02-28 NOTE — Discharge Instructions (Signed)
Ibuprofen 600 mg 3 times daily for the next 3 days.  Tramadol as prescribed as needed for pain not relieved with ibuprofen.  Return to the emergency department if symptoms significantly worsen or change.   Chest Wall Pain Chest wall pain is pain in or around the bones and muscles of your chest. It may take up to 6 weeks to get better. It may take longer if you must stay physically active in your work and activities.  CAUSES  Chest wall pain may happen on its own. However, it may be caused by:  A viral illness like the flu.  Injury.  Coughing.  Exercise.  Arthritis.  Fibromyalgia.  Shingles. HOME CARE INSTRUCTIONS   Avoid overtiring physical activity. Try not to strain or perform activities that cause pain. This includes any activities using your chest or your abdominal and side muscles, especially if heavy weights are used.  Put ice on the sore area.  Put ice in a plastic bag.  Place a towel between your skin and the bag.  Leave the ice on for 15-20 minutes per hour while awake for the first 2 days.  Only take over-the-counter or prescription medicines for pain, discomfort, or fever as directed by your caregiver. SEEK IMMEDIATE MEDICAL CARE IF:   Your pain increases, or you are very uncomfortable.  You have a fever.  Your chest pain becomes worse.  You have new, unexplained symptoms.  You have nausea or vomiting.  You feel sweaty or lightheaded.  You have a cough with phlegm (sputum), or you cough up blood. MAKE SURE YOU:   Understand these instructions.  Will watch your condition.  Will get help right away if you are not doing well or get worse. Document Released: 08/28/2005 Document Revised: 11/20/2011 Document Reviewed: 04/24/2011 [facility] Patient Information 2015 [place], [place]. This information is not intended to replace advice given to you by your health care provider. Make sure you discuss any questions you have with your health care provider.

## 2015-02-28 NOTE — ED Provider Notes (Signed)
CSN: [id number]     Arrival date & time 02/28/15  0027 History   First MD Initiated Contact with Patient 02/28/15 0158     Chief Complaint  Patient presents with  . Right axilla pain      (Consider location/radiation/quality/duration/timing/severity/associated sxs/prior Treatment) HPI Comments: Patient is a 34 year old female with past medical history of cholecystectomy ovaries, gallstones. She presents for evaluation of pain in her right upper chest and axilla. This began in the absence of any injury or trauma. She states she believes there is a swollen area. She denies any fevers or chills. She denies any redness. She denies any shortness of breath. Her pain is worsened with movement of her right arm, palpation, and position.  The history is provided by the patient.    Past Medical History  Diagnosis Date  . Polycystic ovaries   . Fibroid   . Gall bladder disease   . Obesity    Past Surgical History  Procedure Laterality Date  . Cesarean section    . Appendectomy    . Carpal tunnel release    . Ulnar nerve repair    . Radial nerve    . Ganglion cyst excision     History reviewed. No pertinent family history. History  Substance Use Topics  . Smoking status: Current Every Day Smoker -- 1.00 packs/day    Types: Cigarettes  . Smokeless tobacco: Not on file  . Alcohol Use: No   OB History    No data available     Review of Systems  All other systems reviewed and are negative.     Allergies  Latex; Morphine and related; and Toradol  Home Medications   Prior to Admission medications   Medication Sig Start Date End Date Taking? Authorizing Provider  azithromycin (ZITHROMAX) 250 MG tablet Take 1 tablet (250 mg total) by mouth daily. Take first 2 tablets together, then 1 every day until finished. 08/13/13   Fransico Meadow, PA-C  clindamycin (CLEOCIN) 150 MG capsule Take 1 capsule (150 mg total) by mouth every 6 (six) hours. 09/27/13   Hope Bunnie Pion, NP   HYDROcodone-acetaminophen (NORCO/VICODIN) 5-325 MG per tablet Take 1-2 tablets by mouth every 4 (four) hours as needed. 10/14/14   Glendell Docker, NP  lidocaine (XYLOCAINE) 2 % solution Use as directed 20 mLs in the mouth or throat as needed for mouth pain. 09/27/13   Hope Bunnie Pion, NP  ondansetron (ZOFRAN ODT) 4 MG disintegrating tablet Take 1 tablet (4 mg total) by mouth every 8 (eight) hours as needed for nausea or vomiting. 10/14/14   Glendell Docker, NP  phenazopyridine (PYRIDIUM) 200 MG tablet Take 1 tablet (200 mg total) by mouth 3 (three) times daily. 03/07/14   April Palumbo, MD  sulfamethoxazole-trimethoprim (SEPTRA DS) 800-160 MG per tablet Take 1 tablet by mouth 2 (two) times daily. 02/02/14   Ernestina Patches, MD   BP 123/74 mmHg  Pulse 80  Temp(Src) 98 F (36.7 C) (Oral)  Resp 16  Ht 5\' 3"  (1.6 m)  Wt 240 lb (108.863 kg)  BMI 42.52 kg/m2  SpO2 99% Physical Exam  Constitutional: She is oriented to person, place, and time. She appears well-developed and well-nourished. No distress.  HENT:  Head: Normocephalic and atraumatic.  Neck: Normal range of motion. Neck supple.  Cardiovascular: Normal rate and regular rhythm.  Exam reveals no gallop and no friction rub.   No murmur heard. Pulmonary/Chest: Effort normal and breath sounds normal. No respiratory distress. She has no  wheezes.  There is exquisite tenderness to palpation in the soft tissues of the right upper lateral chest wall inferior to the axilla. There is no fluctuance or erythema palpable.  Abdominal: Soft. Bowel sounds are normal. She exhibits no distension. There is no tenderness.  Musculoskeletal: Normal range of motion.  Neurological: She is alert and oriented to person, place, and time.  Skin: Skin is warm and dry. She is not diaphoretic.  Nursing note and vitals reviewed.   ED Course  Procedures (including critical care time) Labs Review Labs Reviewed  LIPASE, BLOOD  CBC WITH DIFFERENTIAL/PLATELET   COMPREHENSIVE METABOLIC PANEL    Imaging Review No results found.   EKG Interpretation None      MDM   Final diagnoses:  None    Patient presents here with complain of pain in her right upper lateral chest wall for the past several days. I see no obvious abnormality in her workup reveals no abnormality on chest x-ray. Her laboratory studies are unremarkable and I highly doubt the gallbladder is the cause. The nature of her discomfort appears to be musculoskeletal. We'll treat with anti-inflammatory, tramadol, and when necessary return.    Veryl Speak, MD 02/28/15 [phone]

## 2015-07-29 IMAGING — CR DG CHEST 2V
2 series · 2 of 2 positions shown · non-contrast
Comparison: None.

CLINICAL DATA: Cough, congestion, smoker

EXAM:
CHEST  2 VIEW

[w chest pa]
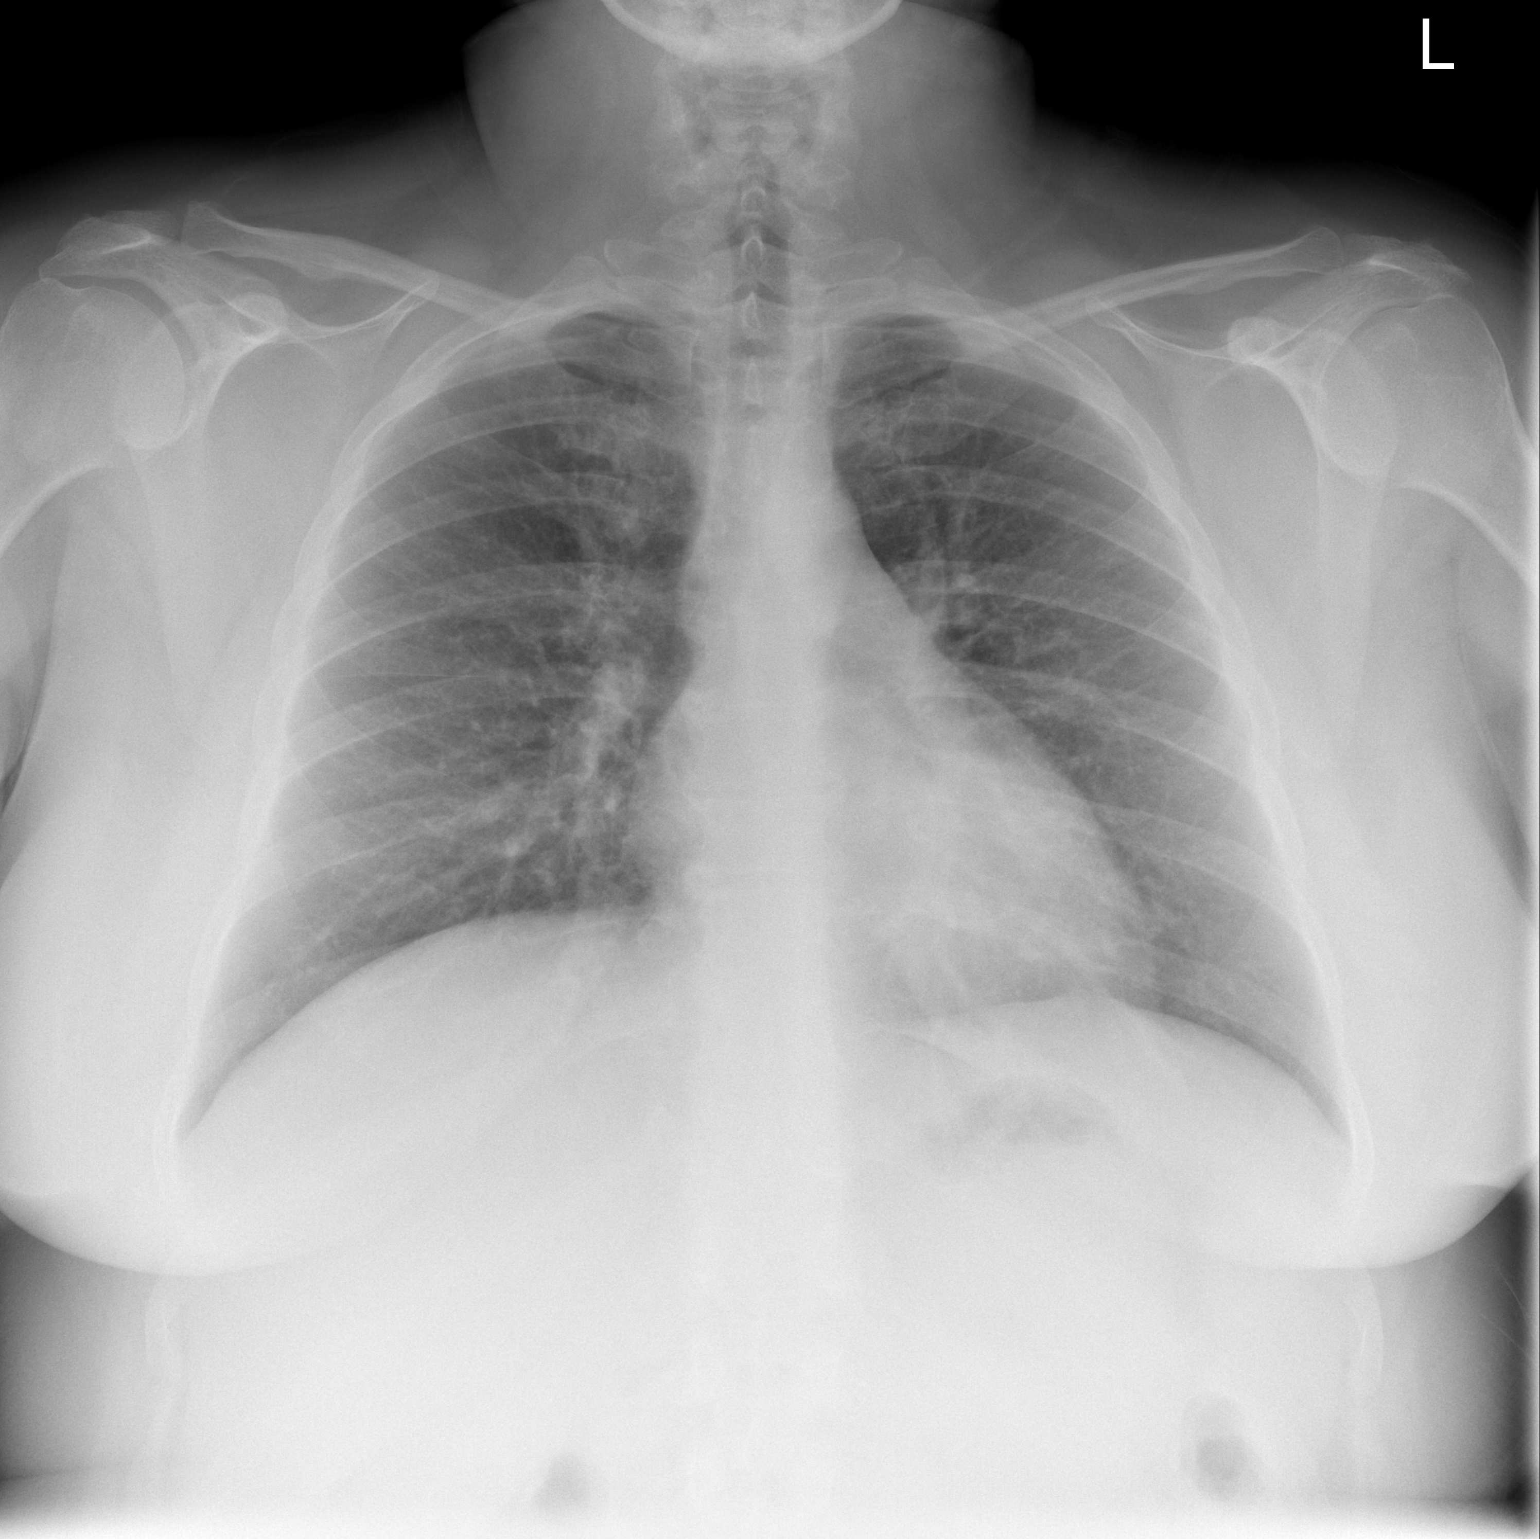

[w chest lat]
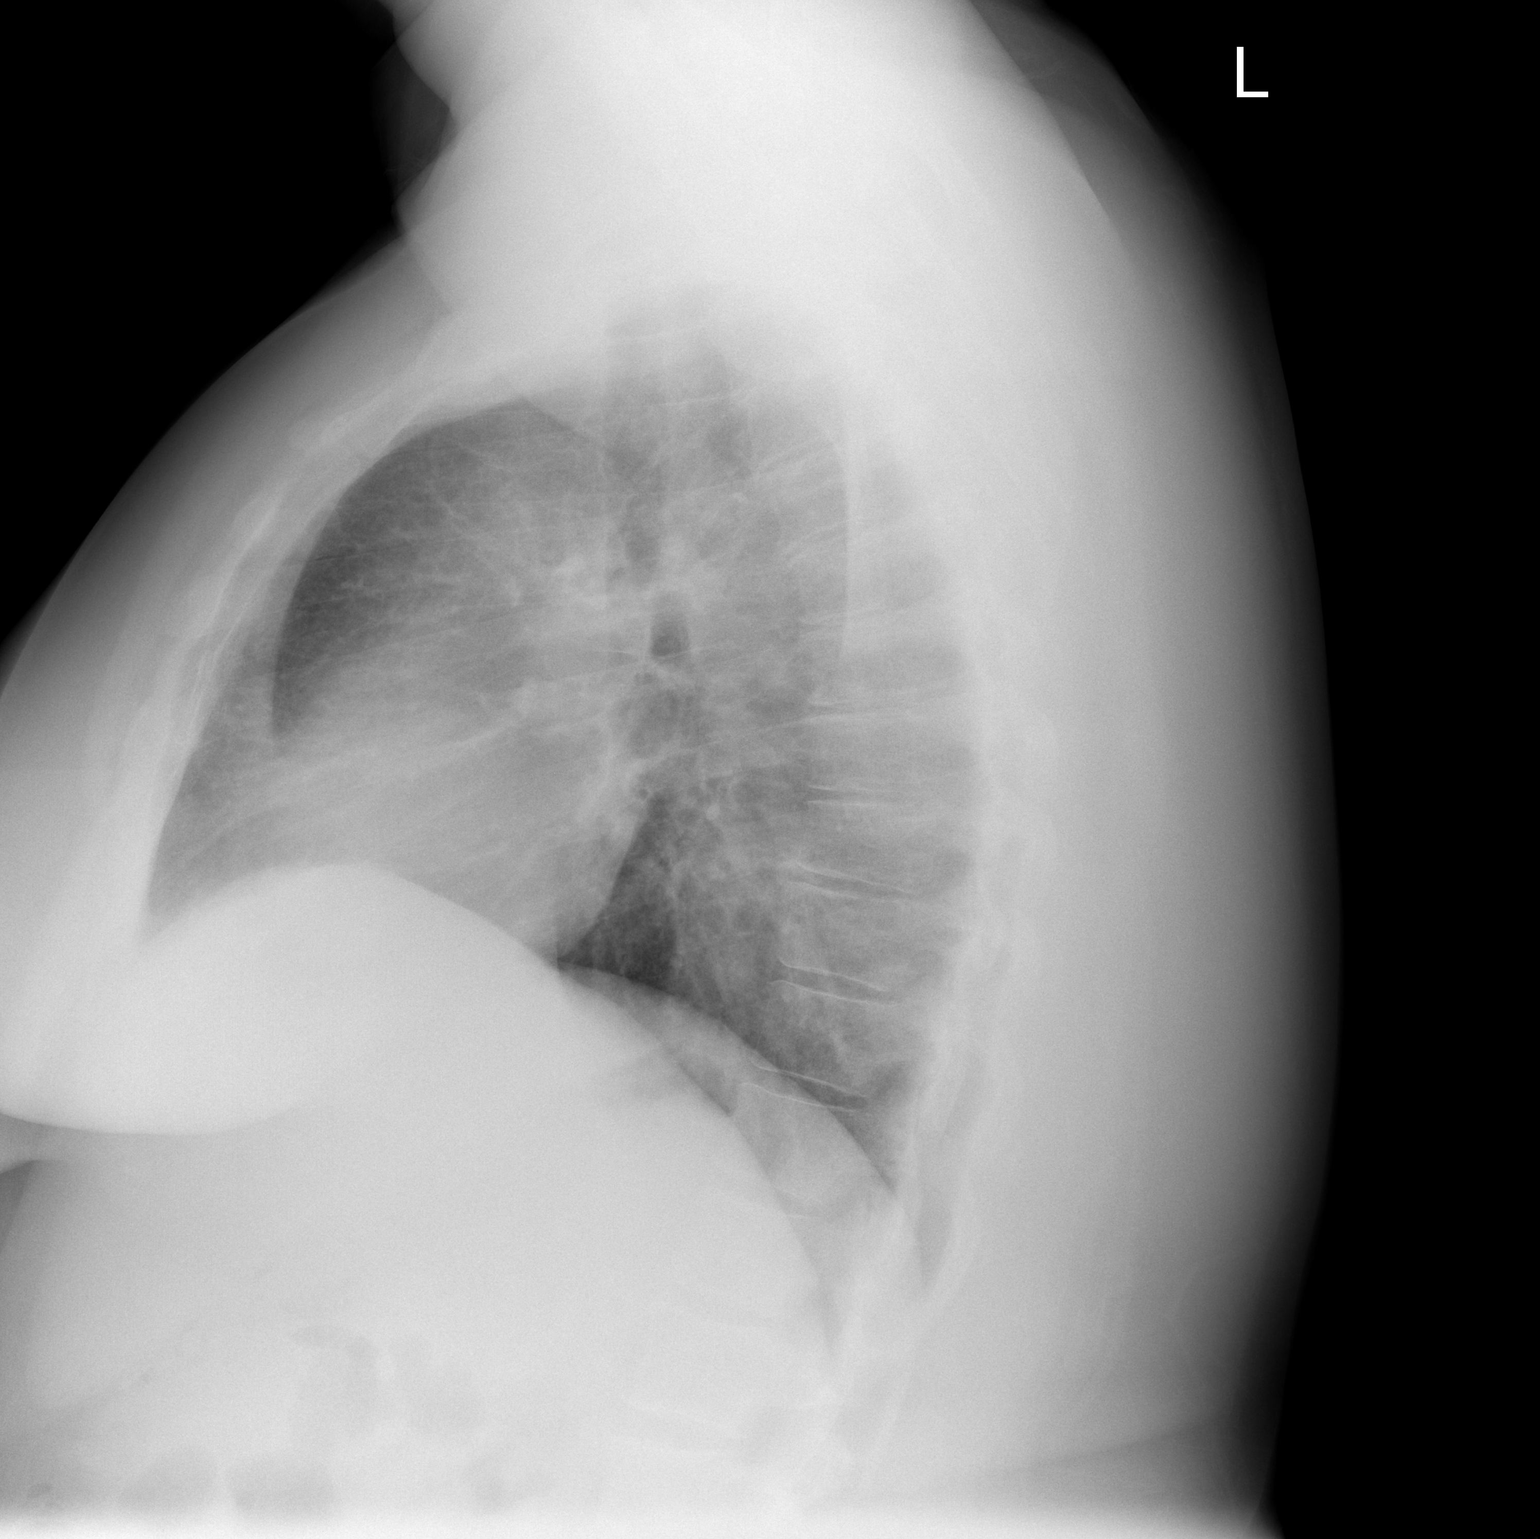

[2 of 2 positions shown; findings below may reference images not displayed]

FINDINGS: Cardiomediastinal silhouette is unremarkable. No acute infiltrate or
pleural effusion. No pulmonary edema. Central mild bronchitic
changes.
IMPRESSION: No acute infiltrate or pulmonary edema. Central mild bronchitic
changes.

## 2017-02-12 IMAGING — DX DG CHEST 2V
2 series · 2 of 2 positions shown · non-contrast
Comparison: 07/08/2014 and earlier.

CLINICAL DATA: 34-year-old female with chest and right axillary
pain. Initial encounter.

EXAM:
CHEST  2 VIEW

[chest pa]
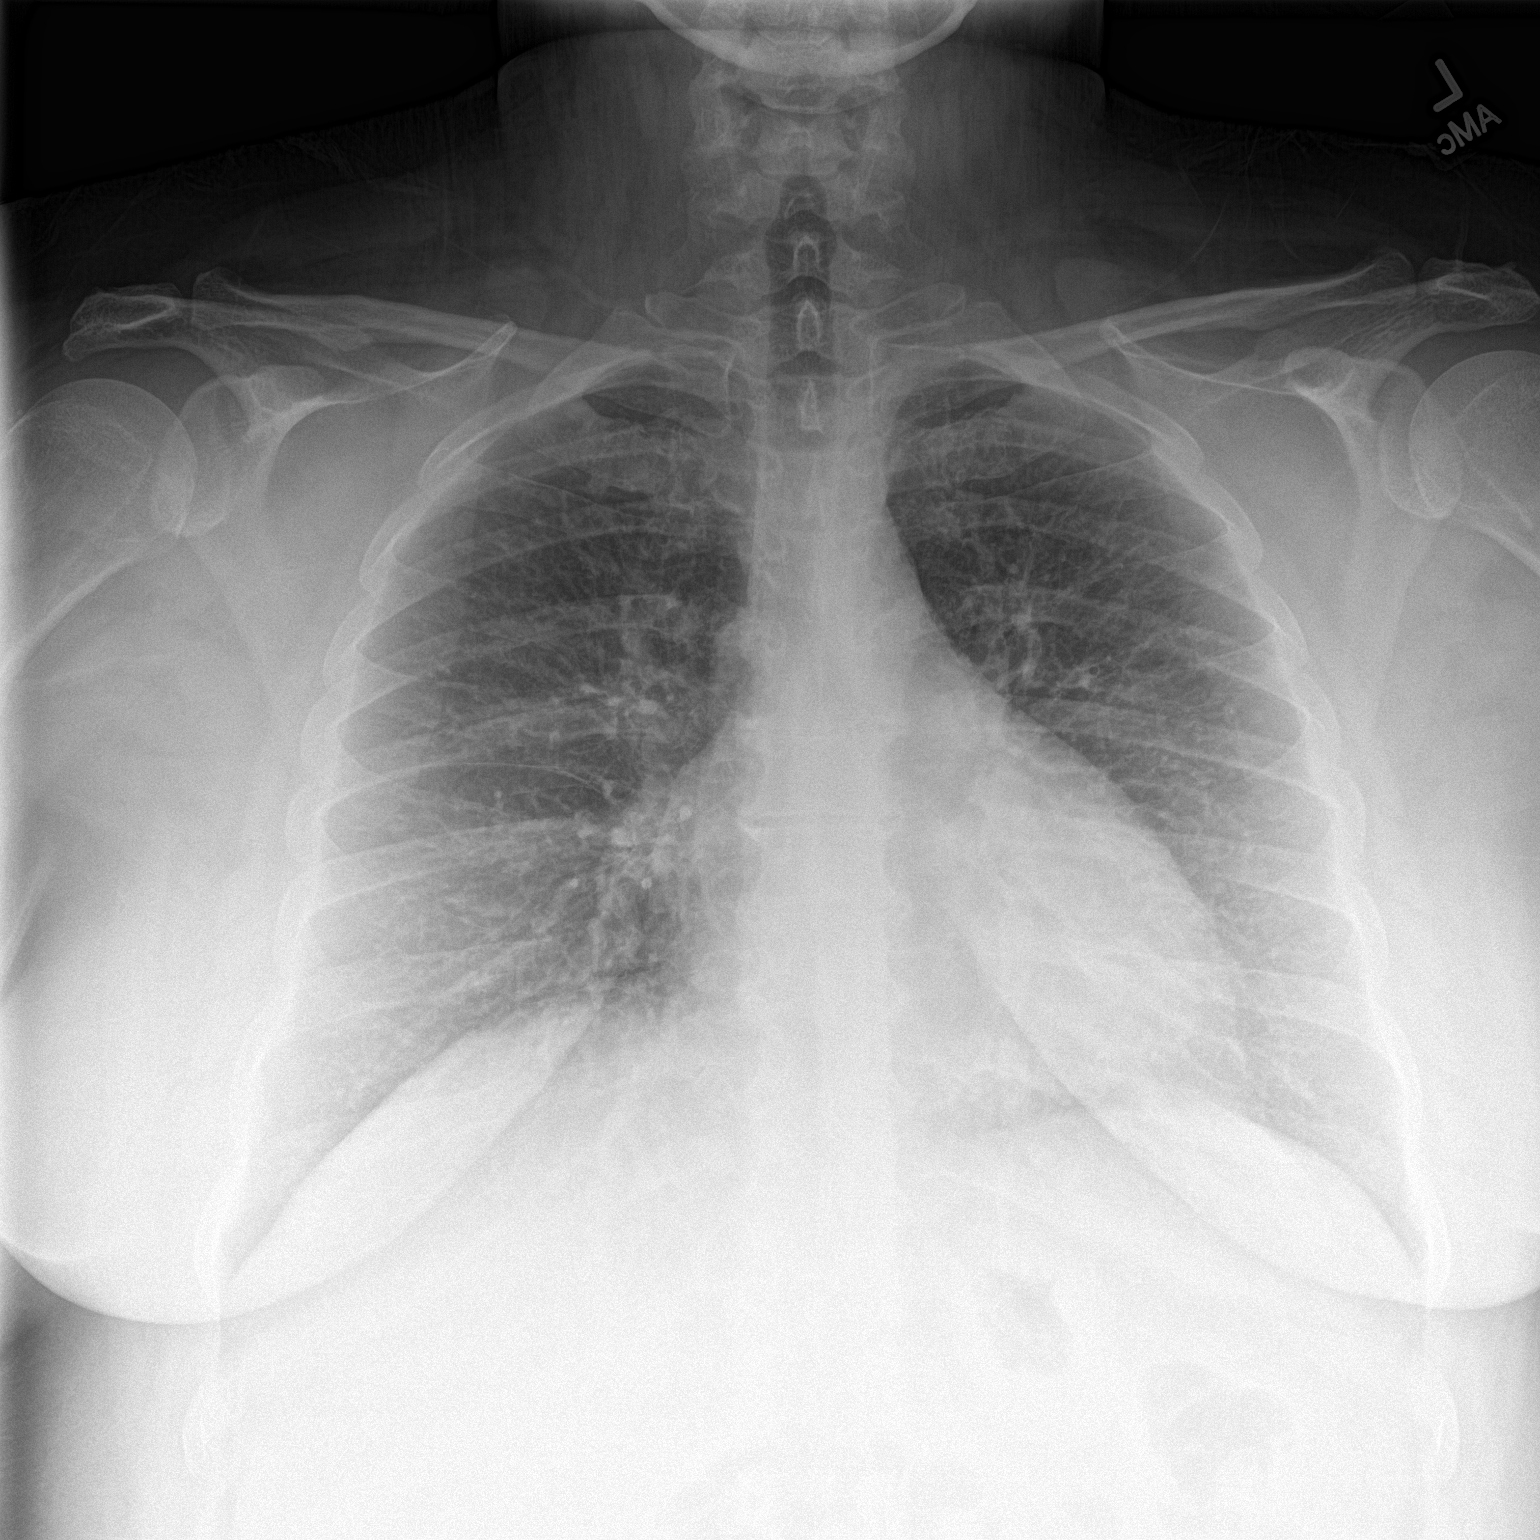

[chest lat]
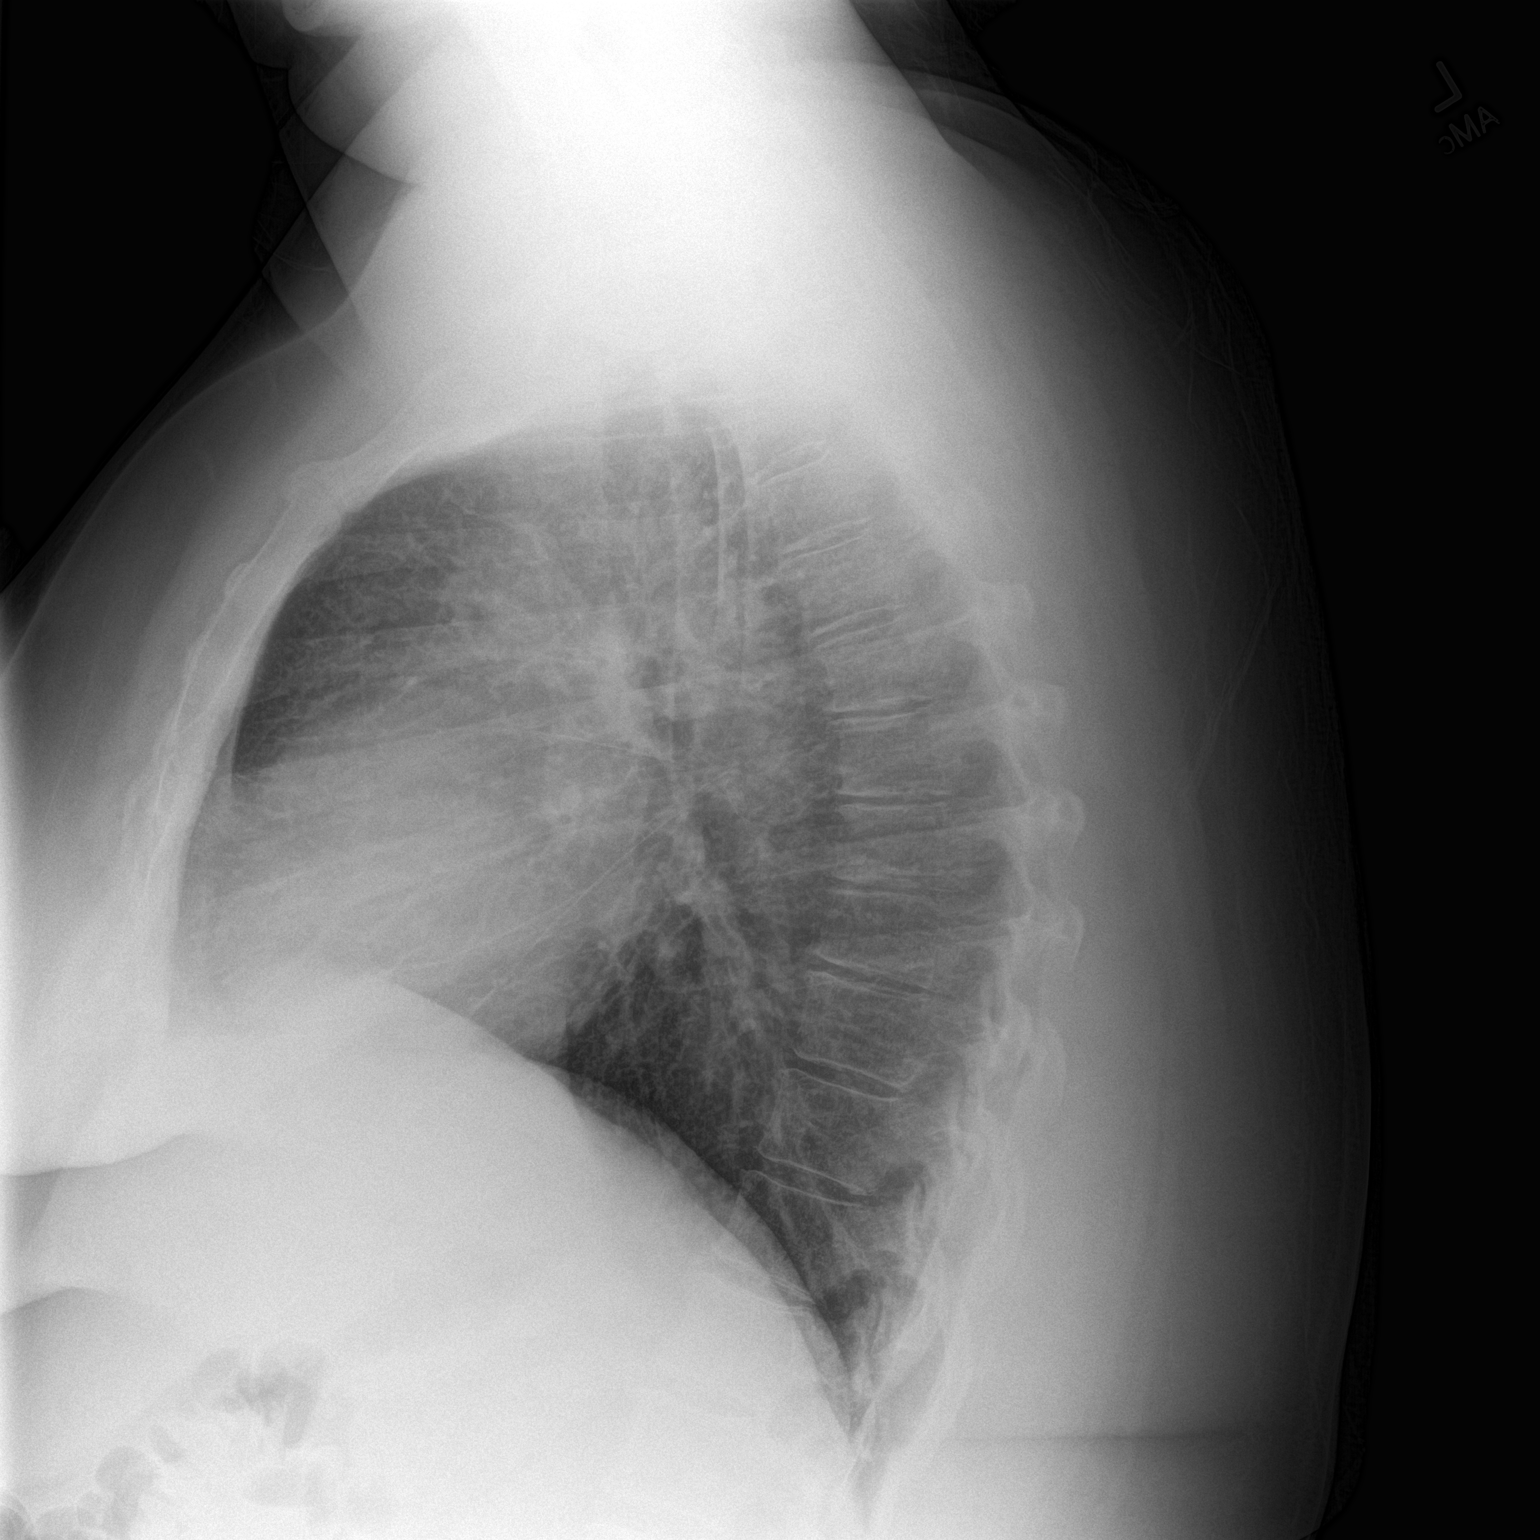

[2 of 2 positions shown; findings below may reference images not displayed]

FINDINGS: Large body habitus. Lung volumes are within normal limits. The
cardiac size is at the upper limits of normal to mildly enlarged.
Other mediastinal contours are within normal limits. Visualized
tracheal air column is within normal limits. No pneumothorax,
pulmonary edema, pleural effusion or confluent pulmonary opacity. No
acute osseous abnormality identified.
IMPRESSION: No acute cardiopulmonary abnormality.
